# Patient Record
Sex: Female | Born: 1985 | Race: Black or African American | Hispanic: No | Marital: Single | Smoking: Never smoker
Health system: Southern US, Community
[De-identification: ages and names within clinical notes are randomized; demographics above are authoritative.]

## PROBLEM LIST (undated history)

## (undated) DIAGNOSIS — N200 Calculus of kidney: Secondary | ICD-10-CM

## (undated) DIAGNOSIS — A749 Chlamydial infection, unspecified: Secondary | ICD-10-CM

## (undated) DIAGNOSIS — L309 Dermatitis, unspecified: Secondary | ICD-10-CM

## (undated) DIAGNOSIS — K219 Gastro-esophageal reflux disease without esophagitis: Secondary | ICD-10-CM

## (undated) DIAGNOSIS — J45909 Unspecified asthma, uncomplicated: Secondary | ICD-10-CM

## (undated) DIAGNOSIS — K819 Cholecystitis, unspecified: Secondary | ICD-10-CM

## (undated) DIAGNOSIS — I499 Cardiac arrhythmia, unspecified: Secondary | ICD-10-CM

## (undated) HISTORY — PX: CHOLECYSTECTOMY: SHX55

## (undated) HISTORY — PX: KNEE SURGERY: SHX244

---
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
## 2005-01-21 ENCOUNTER — Emergency Department (HOSPITAL_COMMUNITY): Admission: EM | Admit: 2005-01-21 | Discharge: 2005-01-21 | Payer: Self-pay | Admitting: Emergency Medicine

## 2005-01-31 ENCOUNTER — Emergency Department (HOSPITAL_COMMUNITY): Admission: EM | Admit: 2005-01-31 | Discharge: 2005-01-31 | Payer: Self-pay | Admitting: Emergency Medicine

## 2005-03-24 ENCOUNTER — Emergency Department (HOSPITAL_COMMUNITY): Admission: EM | Admit: 2005-03-24 | Discharge: 2005-03-24 | Payer: Self-pay | Admitting: Family Medicine

## 2005-05-03 ENCOUNTER — Emergency Department (HOSPITAL_COMMUNITY): Admission: EM | Admit: 2005-05-03 | Discharge: 2005-05-03 | Payer: Self-pay | Admitting: Family Medicine

## 2005-05-05 ENCOUNTER — Emergency Department (HOSPITAL_COMMUNITY): Admission: EM | Admit: 2005-05-05 | Discharge: 2005-05-05 | Payer: Self-pay | Admitting: Emergency Medicine

## 2005-05-14 ENCOUNTER — Emergency Department (HOSPITAL_COMMUNITY): Admission: EM | Admit: 2005-05-14 | Discharge: 2005-05-14 | Payer: Self-pay | Admitting: Family Medicine

## 2005-06-06 ENCOUNTER — Emergency Department (HOSPITAL_COMMUNITY): Admission: EM | Admit: 2005-06-06 | Discharge: 2005-06-06 | Payer: Self-pay | Admitting: Family Medicine

## 2005-12-23 ENCOUNTER — Emergency Department (HOSPITAL_COMMUNITY): Admission: EM | Admit: 2005-12-23 | Discharge: 2005-12-23 | Payer: Self-pay | Admitting: Family Medicine

## 2006-04-01 ENCOUNTER — Emergency Department (HOSPITAL_COMMUNITY): Admission: EM | Admit: 2006-04-01 | Discharge: 2006-04-01 | Payer: Self-pay | Admitting: Emergency Medicine

## 2006-06-02 ENCOUNTER — Inpatient Hospital Stay (HOSPITAL_COMMUNITY): Admission: AD | Admit: 2006-06-02 | Discharge: 2006-06-02 | Payer: Self-pay | Admitting: Family Medicine

## 2006-06-08 ENCOUNTER — Inpatient Hospital Stay (HOSPITAL_COMMUNITY): Admission: AD | Admit: 2006-06-08 | Discharge: 2006-06-08 | Payer: Self-pay | Admitting: Gynecology

## 2006-06-16 ENCOUNTER — Ambulatory Visit (HOSPITAL_COMMUNITY): Admission: RE | Admit: 2006-06-16 | Discharge: 2006-06-16 | Payer: Self-pay | Admitting: Gynecology

## 2006-07-05 ENCOUNTER — Inpatient Hospital Stay (HOSPITAL_COMMUNITY): Admission: AD | Admit: 2006-07-05 | Discharge: 2006-07-05 | Payer: Self-pay | Admitting: Obstetrics & Gynecology

## 2006-08-15 ENCOUNTER — Ambulatory Visit (HOSPITAL_COMMUNITY): Admission: RE | Admit: 2006-08-15 | Discharge: 2006-08-15 | Payer: Self-pay | Admitting: Gynecology

## 2006-12-09 ENCOUNTER — Ambulatory Visit: Payer: Self-pay | Admitting: *Deleted

## 2006-12-09 ENCOUNTER — Inpatient Hospital Stay (HOSPITAL_COMMUNITY): Admission: AD | Admit: 2006-12-09 | Discharge: 2006-12-09 | Payer: Self-pay | Admitting: Obstetrics and Gynecology

## 2009-03-14 ENCOUNTER — Emergency Department (HOSPITAL_COMMUNITY): Admission: EM | Admit: 2009-03-14 | Discharge: 2009-03-14 | Payer: Self-pay | Admitting: Family Medicine

## 2009-10-26 ENCOUNTER — Inpatient Hospital Stay (HOSPITAL_COMMUNITY): Admission: AD | Admit: 2009-10-26 | Discharge: 2009-10-26 | Payer: Self-pay | Admitting: Obstetrics & Gynecology

## 2009-11-21 ENCOUNTER — Emergency Department (HOSPITAL_COMMUNITY): Admission: EM | Admit: 2009-11-21 | Discharge: 2009-11-21 | Payer: Self-pay | Admitting: Emergency Medicine

## 2010-01-12 ENCOUNTER — Inpatient Hospital Stay (HOSPITAL_COMMUNITY): Admission: AD | Admit: 2010-01-12 | Discharge: 2010-01-13 | Payer: Self-pay | Admitting: Obstetrics & Gynecology

## 2010-01-29 ENCOUNTER — Inpatient Hospital Stay (HOSPITAL_COMMUNITY): Admission: AD | Admit: 2010-01-29 | Discharge: 2010-01-29 | Payer: Self-pay | Admitting: Obstetrics & Gynecology

## 2010-02-22 ENCOUNTER — Inpatient Hospital Stay (HOSPITAL_COMMUNITY): Admission: AD | Admit: 2010-02-22 | Discharge: 2010-02-22 | Payer: Self-pay | Admitting: Obstetrics and Gynecology

## 2010-03-06 ENCOUNTER — Inpatient Hospital Stay (HOSPITAL_COMMUNITY): Admission: AD | Admit: 2010-03-06 | Discharge: 2010-03-07 | Payer: Self-pay | Admitting: Obstetrics & Gynecology

## 2010-04-02 ENCOUNTER — Ambulatory Visit (HOSPITAL_COMMUNITY): Admission: RE | Admit: 2010-04-02 | Discharge: 2010-04-02 | Payer: Self-pay | Admitting: Obstetrics

## 2010-06-02 ENCOUNTER — Inpatient Hospital Stay (HOSPITAL_COMMUNITY): Admission: AD | Admit: 2010-06-02 | Discharge: 2010-06-02 | Payer: Self-pay | Admitting: Obstetrics

## 2010-06-15 ENCOUNTER — Emergency Department (HOSPITAL_COMMUNITY): Admission: EM | Admit: 2010-06-15 | Discharge: 2010-06-15 | Payer: Self-pay | Admitting: Emergency Medicine

## 2010-06-18 ENCOUNTER — Ambulatory Visit (HOSPITAL_COMMUNITY): Admission: RE | Admit: 2010-06-18 | Discharge: 2010-06-18 | Payer: Self-pay | Admitting: Obstetrics

## 2010-06-23 ENCOUNTER — Ambulatory Visit (HOSPITAL_COMMUNITY): Admission: RE | Admit: 2010-06-23 | Discharge: 2010-06-23 | Payer: Self-pay | Admitting: Obstetrics

## 2010-07-29 ENCOUNTER — Inpatient Hospital Stay (HOSPITAL_COMMUNITY)
Admission: AD | Admit: 2010-07-29 | Discharge: 2010-07-29 | Payer: Self-pay | Source: Home / Self Care | Admitting: Obstetrics & Gynecology

## 2010-08-12 ENCOUNTER — Emergency Department (HOSPITAL_COMMUNITY)
Admission: EM | Admit: 2010-08-12 | Discharge: 2010-08-12 | Payer: Self-pay | Source: Home / Self Care | Admitting: Family Medicine

## 2010-08-18 ENCOUNTER — Inpatient Hospital Stay (HOSPITAL_COMMUNITY)
Admission: RE | Admit: 2010-08-18 | Discharge: 2010-08-20 | Payer: Self-pay | Source: Home / Self Care | Admitting: Obstetrics

## 2010-11-01 ENCOUNTER — Encounter: Payer: Self-pay | Admitting: Obstetrics

## 2010-12-22 ENCOUNTER — Emergency Department (HOSPITAL_COMMUNITY)
Admission: EM | Admit: 2010-12-22 | Discharge: 2010-12-22 | Disposition: A | Discharge status: Home / Self Care | Payer: Medicaid Other | Attending: Emergency Medicine | Admitting: Emergency Medicine

## 2010-12-22 DIAGNOSIS — R42 Dizziness and giddiness: Secondary | ICD-10-CM | POA: Insufficient documentation

## 2010-12-22 DIAGNOSIS — R51 Headache: Secondary | ICD-10-CM | POA: Insufficient documentation

## 2010-12-22 DIAGNOSIS — J069 Acute upper respiratory infection, unspecified: Secondary | ICD-10-CM | POA: Insufficient documentation

## 2010-12-22 LAB — CBC
HCT: 30.8 % — ABNORMAL LOW (ref 36.0–46.0)
HCT: 33.8 % — ABNORMAL LOW (ref 36.0–46.0)
Hemoglobin: 10.4 g/dL — ABNORMAL LOW (ref 12.0–15.0)
Hemoglobin: 11.4 g/dL — ABNORMAL LOW (ref 12.0–15.0)
MCH: 29.6 pg (ref 26.0–34.0)
MCH: 29.8 pg (ref 26.0–34.0)
MCHC: 33.7 g/dL (ref 30.0–36.0)
MCHC: 33.8 g/dL (ref 30.0–36.0)
MCV: 87.8 fL (ref 78.0–100.0)
MCV: 88.5 fL (ref 78.0–100.0)
Platelets: 182 10*3/uL (ref 150–400)
Platelets: 201 10*3/uL (ref 150–400)
RBC: 3.48 MIL/uL — ABNORMAL LOW (ref 3.87–5.11)
RBC: 3.85 MIL/uL — ABNORMAL LOW (ref 3.87–5.11)
RDW: 14 % (ref 11.5–15.5)
RDW: 14.3 % (ref 11.5–15.5)
WBC: 6.1 10*3/uL (ref 4.0–10.5)
WBC: 9.4 10*3/uL (ref 4.0–10.5)

## 2010-12-22 LAB — RPR: RPR Ser Ql: NONREACTIVE

## 2010-12-23 LAB — URINALYSIS, ROUTINE W REFLEX MICROSCOPIC
Glucose, UA: NEGATIVE mg/dL
Ketones, ur: 15 mg/dL — AB
Nitrite: NEGATIVE
Protein, ur: 30 mg/dL — AB
Specific Gravity, Urine: 1.025 (ref 1.005–1.030)
Urobilinogen, UA: 4 mg/dL — ABNORMAL HIGH (ref 0.0–1.0)
pH: 6 (ref 5.0–8.0)

## 2010-12-23 LAB — URINE CULTURE
Colony Count: 30000
Culture  Setup Time: 201110200106

## 2010-12-23 LAB — URINE MICROSCOPIC-ADD ON

## 2010-12-25 LAB — URINE CULTURE
Colony Count: 30000
Culture  Setup Time: 201108240037

## 2010-12-25 LAB — URINALYSIS, ROUTINE W REFLEX MICROSCOPIC
Bilirubin Urine: NEGATIVE
Glucose, UA: NEGATIVE mg/dL
Hgb urine dipstick: NEGATIVE
Ketones, ur: NEGATIVE mg/dL
Nitrite: NEGATIVE
Protein, ur: NEGATIVE mg/dL
Specific Gravity, Urine: 1.025 (ref 1.005–1.030)
Urobilinogen, UA: 0.2 mg/dL (ref 0.0–1.0)
pH: 6.5 (ref 5.0–8.0)

## 2010-12-25 LAB — URINE MICROSCOPIC-ADD ON

## 2010-12-25 LAB — WET PREP, GENITAL
Clue Cells Wet Prep HPF POC: NONE SEEN
Trich, Wet Prep: NONE SEEN
Yeast Wet Prep HPF POC: NONE SEEN

## 2010-12-27 LAB — URINE MICROSCOPIC-ADD ON

## 2010-12-27 LAB — POCT PREGNANCY, URINE: Preg Test, Ur: NEGATIVE

## 2010-12-27 LAB — URINALYSIS, ROUTINE W REFLEX MICROSCOPIC
Bilirubin Urine: NEGATIVE
Glucose, UA: NEGATIVE mg/dL
Ketones, ur: NEGATIVE mg/dL
Leukocytes, UA: NEGATIVE
Nitrite: NEGATIVE
Protein, ur: NEGATIVE mg/dL
Specific Gravity, Urine: >1.03 — ABNORMAL HIGH (ref 1.005–1.030)
Urobilinogen, UA: 0.2 mg/dL (ref 0.0–1.0)
pH: 5.5 (ref 5.0–8.0)

## 2010-12-28 LAB — URINE CULTURE: Colony Count: >=100000

## 2010-12-28 LAB — URINALYSIS, ROUTINE W REFLEX MICROSCOPIC
Bilirubin Urine: NEGATIVE
Bilirubin Urine: NEGATIVE
Glucose, UA: NEGATIVE mg/dL
Glucose, UA: NEGATIVE mg/dL
Ketones, ur: 15 mg/dL — AB
Ketones, ur: NEGATIVE mg/dL
Leukocytes, UA: NEGATIVE
Nitrite: NEGATIVE
Nitrite: NEGATIVE
Protein, ur: NEGATIVE mg/dL
Protein, ur: NEGATIVE mg/dL
Specific Gravity, Urine: >1.03 — ABNORMAL HIGH (ref 1.005–1.030)
Specific Gravity, Urine: >1.03 — ABNORMAL HIGH (ref 1.005–1.030)
Urobilinogen, UA: 0.2 mg/dL (ref 0.0–1.0)
Urobilinogen, UA: 0.2 mg/dL (ref 0.0–1.0)
pH: 6 (ref 5.0–8.0)
pH: 6 (ref 5.0–8.0)

## 2010-12-28 LAB — URINE MICROSCOPIC-ADD ON

## 2010-12-29 LAB — URINALYSIS, ROUTINE W REFLEX MICROSCOPIC
Bilirubin Urine: NEGATIVE
Glucose, UA: NEGATIVE mg/dL
Hgb urine dipstick: NEGATIVE
Ketones, ur: NEGATIVE mg/dL
Nitrite: NEGATIVE
Protein, ur: NEGATIVE mg/dL
Specific Gravity, Urine: 1.025 (ref 1.005–1.030)
Urobilinogen, UA: 1 mg/dL (ref 0.0–1.0)
pH: 6 (ref 5.0–8.0)

## 2010-12-29 LAB — COMPREHENSIVE METABOLIC PANEL
ALT: 21 U/L (ref 0–35)
AST: 21 U/L (ref 0–37)
Albumin: 3.6 g/dL (ref 3.5–5.2)
Alkaline Phosphatase: 45 U/L (ref 39–117)
BUN: 12 mg/dL (ref 6–23)
CO2: 25 mEq/L (ref 19–32)
Calcium: 9.4 mg/dL (ref 8.4–10.5)
Chloride: 105 mEq/L (ref 96–112)
Creatinine, Ser: 0.79 mg/dL (ref 0.4–1.2)
GFR calc Af Amer: >60 mL/min (ref >60)
GFR calc non Af Amer: >60 mL/min (ref >60)
Glucose, Bld: 91 mg/dL (ref 70–99)
Potassium: 4.1 mEq/L (ref 3.5–5.1)
Sodium: 136 mEq/L (ref 135–145)
Total Bilirubin: 0.3 mg/dL (ref 0.3–1.2)
Total Protein: 7.2 g/dL (ref 6.0–8.3)

## 2010-12-29 LAB — CBC
HCT: 33.8 % — ABNORMAL LOW (ref 36.0–46.0)
Hemoglobin: 11.6 g/dL — ABNORMAL LOW (ref 12.0–15.0)
MCHC: 34.4 g/dL (ref 30.0–36.0)
MCV: 85.9 fL (ref 78.0–100.0)
Platelets: 259 10*3/uL (ref 150–400)
RBC: 3.94 MIL/uL (ref 3.87–5.11)
RDW: 14 % (ref 11.5–15.5)
WBC: 5.3 10*3/uL (ref 4.0–10.5)

## 2010-12-29 LAB — URINE MICROSCOPIC-ADD ON

## 2010-12-29 LAB — URINE CULTURE
Colony Count: NO GROWTH
Culture: NO GROWTH

## 2010-12-30 LAB — ABO/RH: ABO/RH(D): A POS

## 2010-12-30 LAB — WET PREP, GENITAL
Clue Cells Wet Prep HPF POC: NONE SEEN
Trich, Wet Prep: NONE SEEN
Yeast Wet Prep HPF POC: NONE SEEN

## 2010-12-30 LAB — URINALYSIS, ROUTINE W REFLEX MICROSCOPIC
Bilirubin Urine: NEGATIVE
Glucose, UA: NEGATIVE mg/dL
Hgb urine dipstick: NEGATIVE
Ketones, ur: NEGATIVE mg/dL
Nitrite: NEGATIVE
Protein, ur: NEGATIVE mg/dL
Specific Gravity, Urine: >1.03 — ABNORMAL HIGH (ref 1.005–1.030)
Urobilinogen, UA: 0.2 mg/dL (ref 0.0–1.0)
pH: 6 (ref 5.0–8.0)

## 2010-12-30 LAB — GC/CHLAMYDIA PROBE AMP, GENITAL
Chlamydia, DNA Probe: NEGATIVE
GC Probe Amp, Genital: NEGATIVE

## 2010-12-30 LAB — CBC
HCT: 34.3 % — ABNORMAL LOW (ref 36.0–46.0)
Hemoglobin: 11.4 g/dL — ABNORMAL LOW (ref 12.0–15.0)
MCHC: 33.1 g/dL (ref 30.0–36.0)
MCV: 87.6 fL (ref 78.0–100.0)
Platelets: 268 10*3/uL (ref 150–400)
RBC: 3.91 MIL/uL (ref 3.87–5.11)
RDW: 14.1 % (ref 11.5–15.5)
WBC: 5.9 10*3/uL (ref 4.0–10.5)

## 2010-12-30 LAB — HCG, QUANTITATIVE, PREGNANCY: hCG, Beta Chain, Quant, S: 66287 m[IU]/mL — ABNORMAL HIGH (ref <5)

## 2010-12-30 LAB — POCT PREGNANCY, URINE: Preg Test, Ur: POSITIVE

## 2010-12-31 LAB — RAPID STREP SCREEN (MED CTR MEBANE ONLY): Streptococcus, Group A Screen (Direct): NEGATIVE

## 2011-01-27 ENCOUNTER — Encounter: Payer: Medicaid Other | Attending: Internal Medicine | Admitting: *Deleted

## 2011-01-27 DIAGNOSIS — Z713 Dietary counseling and surveillance: Secondary | ICD-10-CM | POA: Insufficient documentation

## 2011-02-24 ENCOUNTER — Ambulatory Visit: Payer: Medicaid Other | Admitting: *Deleted

## 2011-05-13 ENCOUNTER — Inpatient Hospital Stay (INDEPENDENT_AMBULATORY_CARE_PROVIDER_SITE_OTHER)
Admission: RE | Admit: 2011-05-13 | Discharge: 2011-05-13 | Disposition: A | Discharge status: Home / Self Care | Payer: Medicaid Other | Source: Ambulatory Visit | Attending: Family Medicine | Admitting: Family Medicine

## 2011-05-13 DIAGNOSIS — R51 Headache: Secondary | ICD-10-CM

## 2011-07-16 ENCOUNTER — Inpatient Hospital Stay (INDEPENDENT_AMBULATORY_CARE_PROVIDER_SITE_OTHER)
Admission: RE | Admit: 2011-07-16 | Discharge: 2011-07-16 | Disposition: A | Discharge status: Home / Self Care | Payer: Medicaid Other | Source: Ambulatory Visit | Attending: Family Medicine | Admitting: Family Medicine

## 2011-07-16 ENCOUNTER — Ambulatory Visit (INDEPENDENT_AMBULATORY_CARE_PROVIDER_SITE_OTHER): Payer: Medicaid Other

## 2011-07-16 DIAGNOSIS — S5010XA Contusion of unspecified forearm, initial encounter: Secondary | ICD-10-CM

## 2011-07-16 DIAGNOSIS — S5000XA Contusion of unspecified elbow, initial encounter: Secondary | ICD-10-CM

## 2011-07-16 DIAGNOSIS — S63509A Unspecified sprain of unspecified wrist, initial encounter: Secondary | ICD-10-CM

## 2012-08-26 ENCOUNTER — Emergency Department (INDEPENDENT_AMBULATORY_CARE_PROVIDER_SITE_OTHER)
Admission: EM | Admit: 2012-08-26 | Discharge: 2012-08-26 | Disposition: A | Discharge status: Home / Self Care | Payer: Self-pay | Source: Home / Self Care

## 2012-08-26 ENCOUNTER — Encounter (HOSPITAL_COMMUNITY): Payer: Self-pay | Admitting: Emergency Medicine

## 2012-08-26 DIAGNOSIS — L218 Other seborrheic dermatitis: Secondary | ICD-10-CM

## 2012-08-26 DIAGNOSIS — L219 Seborrheic dermatitis, unspecified: Secondary | ICD-10-CM

## 2012-08-26 DIAGNOSIS — M25539 Pain in unspecified wrist: Secondary | ICD-10-CM

## 2012-08-26 HISTORY — DX: Unspecified asthma, uncomplicated: J45.909

## 2012-08-26 MED ORDER — HYDROCODONE-ACETAMINOPHEN 5-325 MG PO TABS
ORAL_TABLET | ORAL | Status: AC
  Filled 2012-08-26: qty 1

## 2012-08-26 MED ORDER — HYDROCORTISONE 1 % EX CREA: TOPICAL_CREAM | CUTANEOUS | Status: DC

## 2012-08-26 MED ORDER — HYDROCODONE-ACETAMINOPHEN 5-325 MG PO TABS
1.0000 | ORAL_TABLET | Freq: Once | ORAL | Status: AC
  Administered 2012-08-26: 1 via ORAL

## 2012-08-26 MED ORDER — KETOCONAZOLE 2 % EX SHAM: MEDICATED_SHAMPOO | CUTANEOUS | Status: DC

## 2012-08-26 MED ORDER — MELOXICAM 7.5 MG PO TABS: 7.5000 mg | ORAL_TABLET | Freq: Every day | ORAL | Status: DC

## 2012-08-26 MED ORDER — METHYLPREDNISOLONE 4 MG PO KIT: PACK | ORAL | Status: DC

## 2012-08-26 NOTE — ED Provider Notes (Signed)
Medical screening examination/treatment/procedure(s) were performed by non-physician practitioner and as supervising physician I was immediately available for consultation/collaboration.  Leslee Home, M.D.   Reuben Likes, MD 08/26/12 [phone]

## 2012-08-26 NOTE — ED Notes (Signed)
Pt c/o right hand pain... States that she heard her wrist "pop" as she was reaching for her cell phone this am... Also states that on 08/03/12, she was electrocuted as she was plugging in a vacuum at work and since then, her right hand has been feeling numb and painful... unable to rotate hand... Sx today include: nausea and diarrhea... Denies: fevers, vomiting... Saw a Doctor that her employer referred her to but did not find anything??

## 2012-08-26 NOTE — ED Provider Notes (Signed)
History     CSN: [id number]  Arrival date & time 08/26/12  0915   None     Chief Complaint  Patient presents with  . Hand Pain    (Consider location/radiation/quality/duration/timing/severity/associated sxs/prior treatment) Patient is a 26 y.o. female presenting with hand pain and rash. The history is provided by the patient.  Hand Pain This is a new problem. The current episode started more than 1 week ago. The problem occurs daily. The problem has not changed since onset.The symptoms are aggravated by bending and twisting. The symptoms are relieved by rest. She has tried nothing for the symptoms.  Rash  This is a chronic problem. The current episode started more than 1 week ago. The problem has been gradually worsening. The problem is associated with nothing. There has been no fever. The rash is present on the scalp and face. The patient is experiencing no pain. Associated symptoms include itching. She has tried nothing for the symptoms.  Patient reports she was reaching for her phone this morning and felt a "pop" in her right wrist.  States she has had pain in this wrist for weeks as result of work injury that was evaluated by work Development worker, community. Feels like pain is increasingly worse after this morning. Past Medical History  Diagnosis Date  . Asthma     Past Surgical History  Procedure Date  . Knee surgery     No family history on file.  History  Substance Use Topics  . Smoking status: Never Smoker   . Smokeless tobacco: Not on file  . Alcohol Use: Yes    OB History    Grav Para Term Preterm Abortions TAB SAB Ect Mult Living                  Review of Systems  Musculoskeletal: Positive for joint swelling and arthralgias.  Skin: Positive for itching and rash.  All other systems reviewed and are negative.    Allergies  Review of patient's allergies indicates no known allergies.  Home Medications   Current Outpatient Rx  Name  Route  Sig  Dispense  Refill  .  HYDROCORTISONE 1 % EX CREA      Apply to affected area 2 times daily   30 g   3   . KETOCONAZOLE 2 % EX SHAM   Topical   Apply topically 2 (two) times a week.   120 mL   11   . MELOXICAM 7.5 MG PO TABS   Oral   Take 1 tablet (7.5 mg total) by mouth daily.   30 tablet   1   . METHYLPREDNISOLONE 4 MG PO KIT      follow package directions   21 tablet   0     BP 103/56  Pulse 76  Temp 98.7 F (37.1 C) (Oral)  Resp 16  SpO2 100%  Physical Exam  Nursing note and vitals reviewed. Constitutional: She is oriented to person, place, and time. Vital signs are normal. She appears well-developed and well-nourished. She is active and cooperative.  HENT:  Head: Normocephalic.       Scalp seborrhea, facial dryness and scaly patches  Eyes: Conjunctivae normal are normal. Pupils are equal, round, and reactive to light. No scleral icterus.  Neck: Trachea normal. Neck supple.  Cardiovascular: Normal rate and regular rhythm.   Pulmonary/Chest: Effort normal and breath sounds normal.  Musculoskeletal: Normal range of motion.       Right elbow: Normal.  Right wrist: Normal.       Left wrist: Normal.       Right hand: Normal.       Painful ROM of right wrist  Neurological: She is alert and oriented to person, place, and time. No cranial nerve deficit or sensory deficit. GCS eye subscore is 4. GCS verbal subscore is 5. GCS motor subscore is 6.  Skin: Skin is warm and dry.  Psychiatric: She has a normal mood and affect. Her speech is normal and behavior is normal. Judgment and thought content normal. Cognition and memory are normal.    ED Course  Procedures (including critical care time)  Labs Reviewed - No data to display No results found.   1. Wrist pain   2. Seborrheic dermatitis of scalp       MDM  Wrist brace, follow up with orthopedist if symptoms do not improve.  Medications as prescribed.        Johnsie Kindred, NP 08/26/12 1010

## 2012-12-11 ENCOUNTER — Emergency Department (INDEPENDENT_AMBULATORY_CARE_PROVIDER_SITE_OTHER)
Admission: EM | Admit: 2012-12-11 | Discharge: 2012-12-11 | Disposition: A | Discharge status: Home / Self Care | Payer: Self-pay | Source: Home / Self Care

## 2012-12-11 ENCOUNTER — Encounter (HOSPITAL_COMMUNITY): Payer: Self-pay | Admitting: *Deleted

## 2012-12-11 DIAGNOSIS — B9789 Other viral agents as the cause of diseases classified elsewhere: Secondary | ICD-10-CM

## 2012-12-11 DIAGNOSIS — H8309 Labyrinthitis, unspecified ear: Secondary | ICD-10-CM

## 2012-12-11 DIAGNOSIS — B349 Viral infection, unspecified: Secondary | ICD-10-CM

## 2012-12-11 DIAGNOSIS — K5289 Other specified noninfective gastroenteritis and colitis: Secondary | ICD-10-CM

## 2012-12-11 DIAGNOSIS — K529 Noninfective gastroenteritis and colitis, unspecified: Secondary | ICD-10-CM

## 2012-12-11 MED ORDER — MECLIZINE HCL 25 MG PO TABS: 25.0000 mg | ORAL_TABLET | Freq: Three times a day (TID) | ORAL | Status: DC | PRN

## 2012-12-11 MED ORDER — ONDANSETRON HCL 4 MG PO TABS: 4.0000 mg | ORAL_TABLET | Freq: Four times a day (QID) | ORAL | Status: DC

## 2012-12-11 MED ORDER — ONDANSETRON HCL 4 MG/2ML IJ SOLN
INTRAMUSCULAR | Status: AC
  Filled 2012-12-11: qty 2

## 2012-12-11 MED ORDER — ONDANSETRON HCL 4 MG/2ML IJ SOLN
4.0000 mg | Freq: Once | INTRAMUSCULAR | Status: AC
  Administered 2012-12-11: 4 mg via INTRAMUSCULAR

## 2012-12-11 MED ORDER — HYOSCYAMINE SULFATE 0.125 MG SL SUBL: 0.1250 mg | SUBLINGUAL_TABLET | SUBLINGUAL | Status: DC | PRN

## 2012-12-11 NOTE — ED Provider Notes (Signed)
History     CSN: [id number]  Arrival date & time 12/11/12  1006   First MD Initiated Contact with Patient 12/11/12 1013      Chief Complaint  Patient presents with  . Generalized Body Aches    (Consider location/radiation/quality/duration/timing/severity/associated sxs/prior treatment) HPI Comments: 27 year old obese female in having dizziness and abdominal cramping for 3 days. Sometimes she will have vertigo particularly when arising from a supine position. It is short-lived it occurs frequently. Worse with other movements turning her head and sometimes with ambulation. Her abdominal cramps are in the epigastrium. She has diarrhea approximately 2-3 stools a day. No vomiting. She does have nausea. She feels tired and weak. She also has headaches in the biparietal and occipital areas. Denies problems with vision speech hearing or swallowing. Denies focal paresthesias or weakness. No syncope. Denies upper respiratory congestion symptoms.   Past Medical History  Diagnosis Date  . Asthma     Past Surgical History  Procedure Laterality Date  . Knee surgery      History reviewed. No pertinent family history.  History  Substance Use Topics  . Smoking status: Never Smoker   . Smokeless tobacco: Not on file  . Alcohol Use: Yes    OB History   Grav Para Term Preterm Abortions TAB SAB Ect Mult Living                  Review of Systems  Constitutional: Positive for activity change and fatigue. Negative for fever, chills and appetite change.  HENT: Negative for ear pain, congestion, facial swelling, rhinorrhea, mouth sores, neck pain, neck stiffness and postnasal drip.   Eyes: Negative.   Respiratory: Negative.   Cardiovascular: Negative.   Gastrointestinal: Positive for nausea, vomiting, abdominal pain and diarrhea. Negative for constipation, blood in stool and abdominal distention.  Genitourinary: Negative.   Musculoskeletal: Positive for myalgias.  Skin: Negative for pallor  and rash.  Neurological: Negative.   Psychiatric/Behavioral: Negative.     Allergies  Review of patient's allergies indicates no known allergies.  Home Medications   Current Outpatient Rx  Name  Route  Sig  Dispense  Refill  . hydrocortisone cream 1 %      Apply to affected area 2 times daily   30 g   3   . ketoconazole (NIZORAL) 2 % shampoo   Topical   Apply topically 2 (two) times a week.   120 mL   11   . meclizine (ANTIVERT) 25 MG tablet   Oral   Take 1 tablet (25 mg total) by mouth 3 (three) times daily as needed for dizziness.   28 tablet   0   . meloxicam (MOBIC) 7.5 MG tablet   Oral   Take 1 tablet (7.5 mg total) by mouth daily.   30 tablet   1   . methylPREDNISolone (MEDROL DOSEPAK) 4 MG tablet      follow package directions   21 tablet   0   . ondansetron (ZOFRAN) 4 MG tablet   Oral   Take 1 tablet (4 mg total) by mouth every 6 (six) hours. Prn nausea or vomiting   12 tablet   0     BP 113/82  Pulse 80  Temp(Src) 98.4 F (36.9 C) (Oral)  Resp 20  SpO2 98%  LMP 11/24/2012  Physical Exam  Nursing note and vitals reviewed. Constitutional: She is oriented to person, place, and time. She appears well-developed and well-nourished. No distress.  HENT:  Right Ear:  External ear normal.  Left Ear: External ear normal.  Nose: Nose normal.  Mouth/Throat: Oropharynx is clear and moist. No oropharyngeal exudate.  Eyes: Conjunctivae and EOM are normal.  Neck: Normal range of motion. Neck supple.  Cardiovascular: Normal rate, regular rhythm and normal heart sounds.   Pulmonary/Chest: Effort normal and breath sounds normal. No respiratory distress. She has no wheezes. She has no rales.  Abdominal: Soft. Bowel sounds are normal. She exhibits no distension and no mass. There is no rebound and no guarding.  Minor epigastric tenderness.  Musculoskeletal: Normal range of motion. She exhibits no edema.  Lymphadenopathy:    She has no cervical adenopathy.   Neurological: She is alert and oriented to person, place, and time.  Skin: Skin is warm and dry. No rash noted.  Psychiatric: She has a normal mood and affect.    ED Course  Procedures (including critical care time)  Labs Reviewed - No data to display No results found.   1. Gastroenteritis, acute   2. Viral labyrinthitis syndrome, unspecified laterality   3. Viral syndrome       MDM  Patient is discharged in stable condition. She is having dizziness but she is able to ambulate in a safe manner. She does not have an acute abdomen nor does she have neurologic deficits. Zofran 4 mg IM Zofran 4 mg by mouth every 6 hours when necessary nausea or vomiting Antivert 25 mg 3 times a day when necessary dizziness Rest at home and drink plenty of fluids. Best fluids to drink are Pedialyte and Gatorade. Out of work for 3 days. Wash her hands frequently If worsening symptoms or problems, unable to hold down medicines increased vomiting or diarrhea, increased fever or other problems may need to go to emergency department.        Hayden Rasmussen, NP 12/11/12 [phone]

## 2012-12-11 NOTE — ED Notes (Signed)
Pt  Has  Multiple   Symptoms  To   Include    dizzyness  boday  Aches  abd   Cramps  Slight  Diarrhea           Fatigue         Symptoms  X  3  Days  -  At this  Time  She is  Sitting  Upright on  Exam table  Speaking in  Complete  sentances  And  Is  In no  Distress

## 2012-12-11 NOTE — ED Provider Notes (Signed)
Medical screening examination/treatment/procedure(s) were performed by non-physician practitioner and as supervising physician I was immediately available for consultation/collaboration.  Leslee Home, M.D.   Reuben Likes, MD 12/11/12 [phone]

## 2013-03-21 ENCOUNTER — Emergency Department (HOSPITAL_COMMUNITY)
Admission: EM | Admit: 2013-03-21 | Discharge: 2013-03-21 | Disposition: A | Discharge status: Home / Self Care | Payer: Self-pay | Attending: Emergency Medicine | Admitting: Emergency Medicine

## 2013-03-21 ENCOUNTER — Encounter (HOSPITAL_COMMUNITY): Payer: Self-pay | Admitting: Emergency Medicine

## 2013-03-21 DIAGNOSIS — J45909 Unspecified asthma, uncomplicated: Secondary | ICD-10-CM | POA: Insufficient documentation

## 2013-03-21 DIAGNOSIS — X500XXA Overexertion from strenuous movement or load, initial encounter: Secondary | ICD-10-CM | POA: Insufficient documentation

## 2013-03-21 DIAGNOSIS — S39012A Strain of muscle, fascia and tendon of lower back, initial encounter: Secondary | ICD-10-CM

## 2013-03-21 DIAGNOSIS — IMO0002 Reserved for concepts with insufficient information to code with codable children: Secondary | ICD-10-CM | POA: Insufficient documentation

## 2013-03-21 DIAGNOSIS — Y9389 Activity, other specified: Secondary | ICD-10-CM | POA: Insufficient documentation

## 2013-03-21 DIAGNOSIS — Y9289 Other specified places as the place of occurrence of the external cause: Secondary | ICD-10-CM | POA: Insufficient documentation

## 2013-03-21 MED ORDER — IBUPROFEN 800 MG PO TABS
800.0000 mg | ORAL_TABLET | Freq: Once | ORAL | Status: AC
  Administered 2013-03-21: 800 mg via ORAL
  Filled 2013-03-21: qty 1

## 2013-03-21 MED ORDER — HYDROCODONE-ACETAMINOPHEN 5-325 MG PO TABS: 1.0000 | ORAL_TABLET | Freq: Four times a day (QID) | ORAL | Status: DC | PRN

## 2013-03-21 MED ORDER — OXYCODONE-ACETAMINOPHEN 5-325 MG PO TABS
1.0000 | ORAL_TABLET | Freq: Once | ORAL | Status: AC
  Administered 2013-03-21: 1 via ORAL
  Filled 2013-03-21: qty 1

## 2013-03-21 MED ORDER — DIAZEPAM 5 MG PO TABS
5.0000 mg | ORAL_TABLET | Freq: Once | ORAL | Status: AC
  Administered 2013-03-21: 5 mg via ORAL
  Filled 2013-03-21: qty 1

## 2013-03-21 MED ORDER — HYDROMORPHONE HCL PF 2 MG/ML IJ SOLN
2.0000 mg | Freq: Once | INTRAMUSCULAR | Status: AC
  Administered 2013-03-21: 1 mg via INTRAMUSCULAR
  Filled 2013-03-21: qty 1

## 2013-03-21 MED ORDER — NAPROXEN 500 MG PO TABS: 500.0000 mg | ORAL_TABLET | Freq: Two times a day (BID) | ORAL | Status: DC

## 2013-03-21 MED ORDER — DIAZEPAM 5 MG PO TABS: 5.0000 mg | ORAL_TABLET | Freq: Four times a day (QID) | ORAL | Status: DC | PRN

## 2013-03-21 NOTE — ED Notes (Signed)
Pt states she has a ride home. 

## 2013-03-21 NOTE — ED Provider Notes (Signed)
History     CSN: [id number]  Arrival date & time 03/21/13  1342   First MD Initiated Contact with Patient 03/21/13 1426      Chief Complaint  Patient presents with  . Back Pain    (Consider location/radiation/quality/duration/timing/severity/associated sxs/prior treatment) HPI  ABAGAIL LIMB is a 27 y.o. female with a history of asthma presents to the ED c/o back pain. Symptoms started this afternoon while cleaning the floor, pt stood up quickly, felt a snap, and immediately had sharp pain radiating from her right hip to her toes. Pain is rated at a 8-9/10 and can be slightly alleviated by leaning on her left hip. Denies trauma, numbness, saddle paresthesias, loss control of bowel or bladder, tingling or fever. Pain is worsened by walking and pt reports subjective right leg weakness. Pt denies CA hx, weight loss, IV drug use or immunocompromised state.     Past Medical History  Diagnosis Date  . Asthma     Past Surgical History  Procedure Laterality Date  . Knee surgery      No family history on file.  History  Substance Use Topics  . Smoking status: Never Smoker   . Smokeless tobacco: Not on file  . Alcohol Use: Yes    OB History   Grav Para Term Preterm Abortions TAB SAB Ect Mult Living                  Review of Systems Ten systems reviewed and are negative for acute change, except as noted in the HPI.    Allergies  Review of patient's allergies indicates no known allergies.  Home Medications   Current Outpatient Rx  Name  Route  Sig  Dispense  Refill  . Doxylamine-DM (VICKS NYQUIL COUGH PO)   Oral   Take 5 mLs by mouth daily as needed.         . hydrocortisone cream 1 %      Apply to affected area 2 times daily   30 g   3   . ibuprofen (ADVIL,MOTRIN) 200 MG tablet   Oral   Take 400 mg by mouth every 6 (six) hours as needed for pain.         . Ibuprofen-Diphenhydramine Cit (MOTRIN PM PO)   Oral   Take 2 tablets by mouth.            BP 127/82  Pulse 66  Temp(Src) 98.3 F (36.8 C) (Oral)  Resp 18  SpO2 100%  Physical Exam  Nursing note and vitals reviewed. Constitutional: She is oriented to person, place, and time. She appears well-developed and well-nourished. No distress.  HENT:  Head: Normocephalic and atraumatic.  Eyes: Conjunctivae and EOM are normal. Pupils are equal, round, and reactive to light. No scleral icterus.  Neck: Normal range of motion and full passive range of motion without pain. Neck supple. No spinous process tenderness and no muscular tenderness present. No rigidity. Normal range of motion present. No Brudzinski's sign noted.  Cardiovascular: Normal rate, regular rhythm and intact distal pulses.  Exam reveals no gallop and no friction rub.   No murmur heard. Intact distal pulses, capillary refill less than 3 seconds bilaterally.   Pulmonary/Chest: Effort normal and breath sounds normal. No respiratory distress. She has no wheezes. She has no rales. She exhibits no tenderness.  Musculoskeletal:  Bilateral lower extremities nontender without color change. Right straight leg test positive. Decreased ROM d/t pain  Neurological: She is alert and oriented to  person, place, and time. She has normal strength and normal reflexes. No sensory deficit. Abnormal gait: no ataxia, slowed and hunched d/t pain   Sensation at baseline for light touch in all 4 distal extremities, right lower extremity weakness 4/5 in comparison to left. Inability to ambulate d/t pain  Skin: Skin is warm and dry. No rash noted. She is not diaphoretic. No erythema. No pallor.  Psychiatric: She has a normal mood and affect.    ED Course  Procedures (including critical care time)  Labs Reviewed - No data to display No results found.   No diagnosis found.    MDM  27 year old morbidly obese patient emergency department complaining of lower back pain status post bending down and quickly standing not.  Unable to ambulate in  emergency department due to pain.  Originally treated with 800 Motrin, Ergostat, and Valium with only minimal relief.  Repeat exam is still concerning for possible objective right leg due to weakness.  IM Dilaudid given.  Provider resuming care will reevaluate physical exam to better differentiate subjective versus objective weakness to see if imaging is needed.  Patient is without signs of cauda equina including loss control of bowel or bladder, saddle paresthesia.  No concern for infection or cancer at this time as patient is not an IV drug user, afebrile, cancer free and not immunocompromised.        Jaci Carrel, [place] 03/21/13 1549

## 2013-03-21 NOTE — ED Provider Notes (Signed)
Pt felt much better after receiving pain medication.  Able to ambulate and rate pain as a 3 when ambulating.  Stable for discharge.  Return precaution discussed.   Fayrene Helper, PA-C 03/21/13 1653

## 2013-03-21 NOTE — Progress Notes (Signed)
[facility] has seen patient and provided her with a oc application. °

## 2013-03-21 NOTE — ED Notes (Signed)
Per EMS: Pt c/o mid/low back pain x 1 hour.  States she was bending down, cleaning, stood up, and began having the pain.

## 2013-03-22 NOTE — ED Provider Notes (Signed)
Medical screening examination/treatment/procedure(s) were performed by non-physician practitioner and as supervising physician I was immediately available for consultation/collaboration.  Mikyla Schachter T Derrek Puff, MD 03/22/13 1313 

## 2013-03-22 NOTE — ED Provider Notes (Signed)
Medical screening examination/treatment/procedure(s) were performed by non-physician practitioner and as supervising physician I was immediately available for consultation/collaboration.   Araceli Arango, MD 03/22/13 1602 

## 2013-03-31 ENCOUNTER — Emergency Department (HOSPITAL_COMMUNITY): Payer: Self-pay

## 2013-03-31 ENCOUNTER — Encounter (HOSPITAL_COMMUNITY): Payer: Self-pay

## 2013-03-31 ENCOUNTER — Emergency Department (HOSPITAL_COMMUNITY)
Admission: EM | Admit: 2013-03-31 | Discharge: 2013-03-31 | Disposition: A | Discharge status: Home / Self Care | Payer: Self-pay | Attending: Emergency Medicine | Admitting: Emergency Medicine

## 2013-03-31 DIAGNOSIS — R350 Frequency of micturition: Secondary | ICD-10-CM | POA: Insufficient documentation

## 2013-03-31 DIAGNOSIS — R300 Dysuria: Secondary | ICD-10-CM | POA: Insufficient documentation

## 2013-03-31 DIAGNOSIS — N390 Urinary tract infection, site not specified: Secondary | ICD-10-CM | POA: Insufficient documentation

## 2013-03-31 DIAGNOSIS — R197 Diarrhea, unspecified: Secondary | ICD-10-CM | POA: Insufficient documentation

## 2013-03-31 DIAGNOSIS — Z3202 Encounter for pregnancy test, result negative: Secondary | ICD-10-CM | POA: Insufficient documentation

## 2013-03-31 DIAGNOSIS — Z79899 Other long term (current) drug therapy: Secondary | ICD-10-CM | POA: Insufficient documentation

## 2013-03-31 DIAGNOSIS — J45909 Unspecified asthma, uncomplicated: Secondary | ICD-10-CM | POA: Insufficient documentation

## 2013-03-31 DIAGNOSIS — N83209 Unspecified ovarian cyst, unspecified side: Secondary | ICD-10-CM | POA: Insufficient documentation

## 2013-03-31 DIAGNOSIS — N200 Calculus of kidney: Secondary | ICD-10-CM | POA: Insufficient documentation

## 2013-03-31 DIAGNOSIS — R112 Nausea with vomiting, unspecified: Secondary | ICD-10-CM | POA: Insufficient documentation

## 2013-03-31 LAB — COMPREHENSIVE METABOLIC PANEL
ALT: 26 U/L (ref 0–35)
AST: 22 U/L (ref 0–37)
Albumin: 3.8 g/dL (ref 3.5–5.2)
Alkaline Phosphatase: 59 U/L (ref 39–117)
BUN: 15 mg/dL (ref 6–23)
CO2: 26 mEq/L (ref 19–32)
Calcium: 9.4 mg/dL (ref 8.4–10.5)
Chloride: 103 mEq/L (ref 96–112)
Creatinine, Ser: 1.51 mg/dL — ABNORMAL HIGH (ref 0.50–1.10)
GFR calc Af Amer: 53 mL/min — ABNORMAL LOW (ref >90)
GFR calc non Af Amer: 46 mL/min — ABNORMAL LOW (ref >90)
Glucose, Bld: 121 mg/dL — ABNORMAL HIGH (ref 70–99)
Potassium: 3.8 mEq/L (ref 3.5–5.1)
Sodium: 137 mEq/L (ref 135–145)
Total Bilirubin: 0.2 mg/dL — ABNORMAL LOW (ref 0.3–1.2)
Total Protein: 7.8 g/dL (ref 6.0–8.3)

## 2013-03-31 LAB — URINALYSIS, ROUTINE W REFLEX MICROSCOPIC
Glucose, UA: NEGATIVE mg/dL
Ketones, ur: NEGATIVE mg/dL
Nitrite: NEGATIVE
Protein, ur: 30 mg/dL — AB
Specific Gravity, Urine: 1.034 — ABNORMAL HIGH (ref 1.005–1.030)
Urobilinogen, UA: 1 mg/dL (ref 0.0–1.0)
pH: 5.5 (ref 5.0–8.0)

## 2013-03-31 LAB — CBC WITH DIFFERENTIAL/PLATELET
Basophils Absolute: 0 10*3/uL (ref 0.0–0.1)
Basophils Relative: 0 % (ref 0–1)
Eosinophils Absolute: 0.1 10*3/uL (ref 0.0–0.7)
Eosinophils Relative: 1 % (ref 0–5)
HCT: 36.8 % (ref 36.0–46.0)
Hemoglobin: 12.2 g/dL (ref 12.0–15.0)
Lymphocytes Relative: 11 % — ABNORMAL LOW (ref 12–46)
Lymphs Abs: 1.1 10*3/uL (ref 0.7–4.0)
MCH: 28.6 pg (ref 26.0–34.0)
MCHC: 33.2 g/dL (ref 30.0–36.0)
MCV: 86.4 fL (ref 78.0–100.0)
Monocytes Absolute: 0.4 10*3/uL (ref 0.1–1.0)
Monocytes Relative: 4 % (ref 3–12)
Neutro Abs: 8.3 10*3/uL — ABNORMAL HIGH (ref 1.7–7.7)
Neutrophils Relative %: 84 % — ABNORMAL HIGH (ref 43–77)
Platelets: 240 10*3/uL (ref 150–400)
RBC: 4.26 MIL/uL (ref 3.87–5.11)
RDW: 13 % (ref 11.5–15.5)
WBC: 9.8 10*3/uL (ref 4.0–10.5)

## 2013-03-31 LAB — URINE MICROSCOPIC-ADD ON

## 2013-03-31 LAB — WET PREP, GENITAL: Yeast Wet Prep HPF POC: NONE SEEN

## 2013-03-31 LAB — POCT PREGNANCY, URINE: Preg Test, Ur: NEGATIVE

## 2013-03-31 MED ORDER — CEPHALEXIN 500 MG PO CAPS: 500.0000 mg | ORAL_CAPSULE | Freq: Four times a day (QID) | ORAL | Status: DC

## 2013-03-31 MED ORDER — HYDROMORPHONE HCL PF 1 MG/ML IJ SOLN
1.0000 mg | Freq: Once | INTRAMUSCULAR | Status: DC
  Filled 2013-03-31: qty 1

## 2013-03-31 MED ORDER — SODIUM CHLORIDE 0.9 % IV BOLUS (SEPSIS)
1000.0000 mL | Freq: Once | INTRAVENOUS | Status: AC
  Administered 2013-03-31: 1000 mL via INTRAVENOUS

## 2013-03-31 MED ORDER — ONDANSETRON HCL 4 MG/2ML IJ SOLN
4.0000 mg | Freq: Once | INTRAMUSCULAR | Status: AC
  Administered 2013-03-31: 4 mg via INTRAVENOUS
  Filled 2013-03-31: qty 2

## 2013-03-31 NOTE — ED Notes (Signed)
Pt stated she thinks she passed a stone several minutes ago while she was in the restroom.

## 2013-03-31 NOTE — ED Provider Notes (Signed)
History     CSN: [id number]  Arrival date & time 03/31/13  0703   First MD Initiated Contact with Patient 03/31/13 [phone]      Chief Complaint  Patient presents with  . Abdominal Pain    (Consider location/radiation/quality/duration/timing/severity/associated sxs/prior treatment) HPI Comments: Patient presents to the emergency department with chief complaint of left lower quadrant pain, which began around 2 or 3:00 this morning. She states the pain radiates to her left groin. She also endorses nausea, vomiting, and loose stools. She denies seeing any blood in her vomit or stool. She states that she has taken some Motrin for the pain, but this has not helped. She states that the pain is moderate in severity, and is a sharp aching in nature. She also endorses dysuria and frequency. She states the pain is better when she lays on her side.  The history is provided by the patient. No language interpreter was used.    Past Medical History  Diagnosis Date  . Asthma     Past Surgical History  Procedure Laterality Date  . Knee surgery      No family history on file.  History  Substance Use Topics  . Smoking status: Never Smoker   . Smokeless tobacco: Not on file  . Alcohol Use: Yes    OB History   Grav Para Term Preterm Abortions TAB SAB Ect Mult Living                  Review of Systems  All other systems reviewed and are negative.    Allergies  Review of patient's allergies indicates no known allergies.  Home Medications   Current Outpatient Rx  Name  Route  Sig  Dispense  Refill  . diazepam (VALIUM) 5 MG tablet   Oral   Take 1 tablet (5 mg total) by mouth every 6 (six) hours as needed for anxiety.   20 tablet   0   . Doxylamine-DM (VICKS NYQUIL COUGH PO)   Oral   Take 5 mLs by mouth daily as needed.         Marland Kitchen HYDROcodone-acetaminophen (NORCO/VICODIN) 5-325 MG per tablet   Oral   Take 1 tablet by mouth every 6 (six) hours as needed for pain.   15  tablet   0   . hydrocortisone cream 1 %      Apply to affected area 2 times daily   30 g   3   . ibuprofen (ADVIL,MOTRIN) 200 MG tablet   Oral   Take 400 mg by mouth every 6 (six) hours as needed for pain.         . Ibuprofen-Diphenhydramine Cit (MOTRIN PM PO)   Oral   Take 2 tablets by mouth.         . naproxen (NAPROSYN) 500 MG tablet   Oral   Take 1 tablet (500 mg total) by mouth 2 (two) times daily.   30 tablet   0     BP 147/98  Pulse 60  Temp(Src) 98 F (36.7 C) (Oral)  SpO2 100%  LMP 02/24/2013  Physical Exam  Nursing note and vitals reviewed. Constitutional: She is oriented to person, place, and time. She appears well-developed and well-nourished.  HENT:  Head: Normocephalic and atraumatic.  Eyes: Conjunctivae and EOM are normal. Pupils are equal, round, and reactive to light.  Neck: Normal range of motion. Neck supple.  Cardiovascular: Normal rate and regular rhythm.  Exam reveals no gallop and no friction  rub.   No murmur heard. Pulmonary/Chest: Effort normal and breath sounds normal. No respiratory distress. She has no wheezes. She has no rales. She exhibits no tenderness.  Abdominal: Soft. Bowel sounds are normal. She exhibits no distension and no mass. There is tenderness. There is no rebound and no guarding. Hernia confirmed negative in the right inguinal area and confirmed negative in the left inguinal area.  Left lower quadrant abdominal pain, no other focal abdominal tenderness, no signs of surgical acute abdomen  Genitourinary: No labial fusion. There is no rash, tenderness, lesion or injury on the right labia. There is no rash, tenderness, lesion or injury on the left labia. Uterus is not deviated, not enlarged, not fixed and not tender. Cervix exhibits no motion tenderness, no discharge and no friability. Right adnexum displays no mass, no tenderness and no fullness. Left adnexum displays no mass, no tenderness and no fullness. No erythema,  tenderness or bleeding around the vagina. No foreign body around the vagina. No signs of injury around the vagina. Vaginal discharge found.  Musculoskeletal: Normal range of motion. She exhibits no edema and no tenderness.  Lymphadenopathy:       Right: No inguinal adenopathy present.       Left: No inguinal adenopathy present.  Neurological: She is alert and oriented to person, place, and time.  Skin: Skin is warm and dry.  Psychiatric: She has a normal mood and affect. Her behavior is normal. Judgment and thought content normal.    ED Course  Procedures (including critical care time)  Results for orders placed during the hospital encounter of 03/31/13  WET PREP, GENITAL      Result Value Range   Yeast Wet Prep HPF POC NONE SEEN  NONE SEEN   Trich, Wet Prep FEW (*) NONE SEEN   Clue Cells Wet Prep HPF POC FEW (*) NONE SEEN   WBC, Wet Prep HPF POC FEW (*) NONE SEEN  CBC WITH DIFFERENTIAL      Result Value Range   WBC 9.8  4.0 - 10.5 K/uL   RBC 4.26  3.87 - 5.11 MIL/uL   Hemoglobin 12.2  12.0 - 15.0 g/dL   HCT 40.9  81.1 - 91.4 %   MCV 86.4  78.0 - 100.0 fL   MCH 28.6  26.0 - 34.0 pg   MCHC 33.2  30.0 - 36.0 g/dL   RDW 78.2  95.6 - 21.3 %   Platelets 240  150 - 400 K/uL   Neutrophils Relative % 84 (*) 43 - 77 %   Neutro Abs 8.3 (*) 1.7 - 7.7 K/uL   Lymphocytes Relative 11 (*) 12 - 46 %   Lymphs Abs 1.1  0.7 - 4.0 K/uL   Monocytes Relative 4  3 - 12 %   Monocytes Absolute 0.4  0.1 - 1.0 K/uL   Eosinophils Relative 1  0 - 5 %   Eosinophils Absolute 0.1  0.0 - 0.7 K/uL   Basophils Relative 0  0 - 1 %   Basophils Absolute 0.0  0.0 - 0.1 K/uL  COMPREHENSIVE METABOLIC PANEL      Result Value Range   Sodium 137  135 - 145 mEq/L   Potassium 3.8  3.5 - 5.1 mEq/L   Chloride 103  96 - 112 mEq/L   CO2 26  19 - 32 mEq/L   Glucose, Bld 121 (*) 70 - 99 mg/dL   BUN 15  6 - 23 mg/dL   Creatinine, Ser 0.86 (*) 0.50 -  1.10 mg/dL   Calcium 9.4  8.4 - 62.1 mg/dL   Total Protein 7.8  6.0 -  8.3 g/dL   Albumin 3.8  3.5 - 5.2 g/dL   AST 22  0 - 37 U/L   ALT 26  0 - 35 U/L   Alkaline Phosphatase 59  39 - 117 U/L   Total Bilirubin 0.2 (*) 0.3 - 1.2 mg/dL   GFR calc non Af Amer 46 (*) >90 mL/min   GFR calc Af Amer 53 (*) >90 mL/min  URINALYSIS, ROUTINE W REFLEX MICROSCOPIC      Result Value Range   Color, Urine YELLOW  YELLOW   APPearance CLOUDY (*) CLEAR   Specific Gravity, Urine 1.034 (*) 1.005 - 1.030   pH 5.5  5.0 - 8.0   Glucose, UA NEGATIVE  NEGATIVE mg/dL   Hgb urine dipstick SMALL (*) NEGATIVE   Bilirubin Urine SMALL (*) NEGATIVE   Ketones, ur NEGATIVE  NEGATIVE mg/dL   Protein, ur 30 (*) NEGATIVE mg/dL   Urobilinogen, UA 1.0  0.0 - 1.0 mg/dL   Nitrite NEGATIVE  NEGATIVE   Leukocytes, UA SMALL (*) NEGATIVE  URINE MICROSCOPIC-ADD ON      Result Value Range   Squamous Epithelial / LPF MANY (*) RARE   WBC, UA 7-10  <3 WBC/hpf   RBC / HPF 3-6  <3 RBC/hpf   Bacteria, UA FEW (*) RARE   Crystals CA OXALATE CRYSTALS (*) NEGATIVE  POCT PREGNANCY, URINE      Result Value Range   Preg Test, Ur NEGATIVE  NEGATIVE   Ct Abdomen Pelvis Wo Contrast  03/31/2013   *RADIOLOGY REPORT*  Clinical Data: Abdominal pain, left flank pain and evidence of mild left hydronephrosis by renal ultrasound.  CT ABDOMEN AND PELVIS WITHOUT CONTRAST  Technique:  Multidetector CT imaging of the abdomen and pelvis was performed following the standard protocol without intravenous contrast.  Comparison: Renal ultrasound earlier today.  Findings: There is no evidence of hydronephrosis by CT.  No renal or ureteral calculi are identified.  The bladder is decompressed and unremarkable in appearance.  Unenhanced appearance of the liver, spleen, pancreas and adrenal glands are within normal limits.  There is a single punctate calcified gallstone in the gallbladder without evidence of gallbladder inflammation or biliary ductal dilatation.  Bowel loops are unremarkable.  No free fluid or abnormal fluid collection  is identified.  Uterus and adnexal regions are unremarkable by CT.  No hernias, masses or enlarged lymph nodes are seen.  Bony structures are unremarkable.  IMPRESSION: No acute findings by CT.  There is no appreciable hydronephrosis by CT and no evidence of renal or ureteral calculi.  A single calcified gallstone is visualized.   Original Report Authenticated By: Irish Lack, M.D.   US Transvaginal Non-ob  03/31/2013   *RADIOLOGY REPORT*  Clinical Data: Left lower quadrant and flank pain.  Evaluate for ovarian torsion.  TRANSABDOMINAL AND TRANSVAGINAL ULTRASOUND OF PELVIS Technique:  Both transabdominal and transvaginal ultrasound examinations of the pelvis were performed. Transabdominal technique was performed for global imaging of the pelvis including uterus, ovaries, adnexal regions, and pelvic cul-de-sac. Color and duplex sonographic imaging was also performed to evaluate blood flow to the ovaries.  It was necessary to proceed with endovaginal exam following the transabdominal exam to visualize the uterus and ovaries to better advantage.  Comparison:  None  Findings:  Uterus: Uterus is normal in size and echogenicity measuring 9.1 cm x 4.8 cm x 6.5 cm.  There  is a hypoechoic 2 cm x 1.9 cm x 2.3 cm fibroid in the posterior upper uterine segment.  This is a mural fibroid.  The uterus is anteverted and mildly anteflexed.  Endometrium: The endometrium is normal in thickness and smooth measuring 5.2 mm.  Right ovary:  Right ovary demonstrates a dominant, 3.7 cm cyst. The ovary measures 39 mm x 22 ml x 28 mm.  There is normal arterial and venous blood flow to the right ovary.  Left ovary: The left ovary demonstrates a 2.4 cm cyst.  The ovary measures 32 mm x 24 mm x 23 mm.  There is normal arterial venous blood flow to the left ovary.  Other findings: No adnexal masses.  No pelvic free fluid.  IMPRESSION: Bilateral, dominant, simple appearing ovarian cysts.  The ovaries are otherwise unremarkable.  There is no  torsion.  2.3 cm uterine fibroid.  No other uterine abnormalities.   Original Report Authenticated By: Amie Portland, M.D.   US Pelvis Complete  03/31/2013   *RADIOLOGY REPORT*  Clinical Data: Left lower quadrant and flank pain.  Evaluate for ovarian torsion.  TRANSABDOMINAL AND TRANSVAGINAL ULTRASOUND OF PELVIS Technique:  Both transabdominal and transvaginal ultrasound examinations of the pelvis were performed. Transabdominal technique was performed for global imaging of the pelvis including uterus, ovaries, adnexal regions, and pelvic cul-de-sac. Color and duplex sonographic imaging was also performed to evaluate blood flow to the ovaries.  It was necessary to proceed with endovaginal exam following the transabdominal exam to visualize the uterus and ovaries to better advantage.  Comparison:  None  Findings:  Uterus: Uterus is normal in size and echogenicity measuring 9.1 cm x 4.8 cm x 6.5 cm.  There is a hypoechoic 2 cm x 1.9 cm x 2.3 cm fibroid in the posterior upper uterine segment.  This is a mural fibroid.  The uterus is anteverted and mildly anteflexed.  Endometrium: The endometrium is normal in thickness and smooth measuring 5.2 mm.  Right ovary:  Right ovary demonstrates a dominant, 3.7 cm cyst. The ovary measures 39 mm x 22 ml x 28 mm.  There is normal arterial and venous blood flow to the right ovary.  Left ovary: The left ovary demonstrates a 2.4 cm cyst.  The ovary measures 32 mm x 24 mm x 23 mm.  There is normal arterial venous blood flow to the left ovary.  Other findings: No adnexal masses.  No pelvic free fluid.  IMPRESSION: Bilateral, dominant, simple appearing ovarian cysts.  The ovaries are otherwise unremarkable.  There is no torsion.  2.3 cm uterine fibroid.  No other uterine abnormalities.   Original Report Authenticated By: Amie Portland, M.D.   US Renal  03/31/2013   *RADIOLOGY REPORT*  Clinical Data: Left flank pain.  Evaluate for hydronephrosis.  RENAL/URINARY TRACT ULTRASOUND  COMPLETE  Comparison:  None.  Findings:  Right Kidney:  Normal in size and echogenicity.  No right renal mass or stone.  No hydronephrosis.  Right kidney measures 10.8 cm in length.  Left Kidney:  There is mild dilation of the left renal pelvis and renal calyces reflecting mild hydronephrosis.  No renal or ureteral stone is seen.  There are no renal masses.  The left kidney measures 12.6 cm in length.  Bladder:  The bladder decompressed not well evaluated.  IMPRESSION: Mild left hydronephrosis.  Given the symptoms of left flank pain, a ureteral stones is suspected, and a follow-up unenhanced CT of the abdomen pelvis is recommended.   Original Report  Authenticated By: Amie Portland, M.D.   [place] Art/ven Flow Abd Pelv Doppler  03/31/2013   *RADIOLOGY REPORT*  Clinical Data: Left lower quadrant and flank pain.  Evaluate for ovarian torsion.  TRANSABDOMINAL AND TRANSVAGINAL ULTRASOUND OF PELVIS Technique:  Both transabdominal and transvaginal ultrasound examinations of the pelvis were performed. Transabdominal technique was performed for global imaging of the pelvis including uterus, ovaries, adnexal regions, and pelvic cul-de-sac. Color and duplex sonographic imaging was also performed to evaluate blood flow to the ovaries.  It was necessary to proceed with endovaginal exam following the transabdominal exam to visualize the uterus and ovaries to better advantage.  Comparison:  None  Findings:  Uterus: Uterus is normal in size and echogenicity measuring 9.1 cm x 4.8 cm x 6.5 cm.  There is a hypoechoic 2 cm x 1.9 cm x 2.3 cm fibroid in the posterior upper uterine segment.  This is a mural fibroid.  The uterus is anteverted and mildly anteflexed.  Endometrium: The endometrium is normal in thickness and smooth measuring 5.2 mm.  Right ovary:  Right ovary demonstrates a dominant, 3.7 cm cyst. The ovary measures 39 mm x 22 ml x 28 mm.  There is normal arterial and venous blood flow to the right ovary.  Left ovary: The left  ovary demonstrates a 2.4 cm cyst.  The ovary measures 32 mm x 24 mm x 23 mm.  There is normal arterial venous blood flow to the left ovary.  Other findings: No adnexal masses.  No pelvic free fluid.  IMPRESSION: Bilateral, dominant, simple appearing ovarian cysts.  The ovaries are otherwise unremarkable.  There is no torsion.  2.3 cm uterine fibroid.  No other uterine abnormalities.   Original Report Authenticated By: Amie Portland, M.D.      1. Kidney stone   2. UTI (lower urinary tract infection)   3. Ovarian cyst       MDM  Patient with left lower quadrant abdominal pain, nausea, and vomiting. Check pelvic exam, urinalysis, and pelvic ultrasound.  9:13 AM Discussed the patient with Dr. Silverio Lay, will order CT to evaluate for stone.  Concern is for septic stone.  Ct is negative, patient states that her pain is controlled. 1 diarrhea and the room, the patient states that she had recently gone to the bathroom, and remarks "I think I just passed the stone."  Will discharge the patient to home with OB/GYN followup. Will give her a prescription of Keflex , and instructions to have her creatinine rechecked. Patient understands and agrees to plan. She is stable and ready for discharge.        Roxy Horseman, PA-C 03/31/13 1049

## 2013-03-31 NOTE — ED Notes (Signed)
PER EMS- pt states that last night around 9pm she began to have LLQ abd pain that radiates to groin area. Vomiting, loose stools. No fever. Pt denies recent strenuous movement or eating something new for dinner. Pt also reports painful, frequent urination, no burning. And states the pain improves when she lays on her side. VS: BP-138/78, HR-70, RR-16.

## 2013-04-01 LAB — URINE CULTURE: Colony Count: >=100000

## 2013-04-01 NOTE — ED Provider Notes (Signed)
Medical screening examination/treatment/procedure(s) were performed by non-physician practitioner and as supervising physician I was immediately available for consultation/collaboration.   Dejanira Pamintuan H Shawntavia Saunders, MD 04/01/13 0658 

## 2013-04-02 LAB — GC/CHLAMYDIA PROBE AMP
CT Probe RNA: POSITIVE — AB
GC Probe RNA: NEGATIVE

## 2013-04-06 NOTE — ED Notes (Signed)
+  Chlamydia Chart sent to EDP office for review.  

## 2013-04-08 ENCOUNTER — Telehealth (HOSPITAL_COMMUNITY): Payer: Self-pay | Admitting: Emergency Medicine

## 2013-04-08 NOTE — ED Notes (Signed)
Pt ID verified x 2.  Pt informed of Dx, need for addl tx, notify partner(s) for testing and tx.  and abstain from sex x 2 wks from tx time.  Rx called to [facility] [phone] and left on VM.

## 2013-06-07 ENCOUNTER — Ambulatory Visit: Payer: Self-pay | Admitting: Internal Medicine

## 2013-06-08 ENCOUNTER — Ambulatory Visit: Payer: Self-pay | Admitting: Internal Medicine

## 2013-06-08 DIAGNOSIS — Z0289 Encounter for other administrative examinations: Secondary | ICD-10-CM

## 2013-08-10 ENCOUNTER — Encounter (HOSPITAL_COMMUNITY): Payer: Self-pay | Admitting: Emergency Medicine

## 2013-08-10 ENCOUNTER — Emergency Department (INDEPENDENT_AMBULATORY_CARE_PROVIDER_SITE_OTHER)
Admission: EM | Admit: 2013-08-10 | Discharge: 2013-08-10 | Disposition: A | Discharge status: Home / Self Care | Payer: Self-pay | Source: Home / Self Care | Attending: Family Medicine | Admitting: Family Medicine

## 2013-08-10 DIAGNOSIS — J069 Acute upper respiratory infection, unspecified: Secondary | ICD-10-CM

## 2013-08-10 LAB — POCT RAPID STREP A: Streptococcus, Group A Screen (Direct): NEGATIVE

## 2013-08-10 MED ORDER — HYDROCOD POLST-CHLORPHEN POLST 10-8 MG/5ML PO LQCR: 5.0000 mL | Freq: Two times a day (BID) | ORAL | Status: DC | PRN

## 2013-08-10 MED ORDER — IPRATROPIUM BROMIDE 0.06 % NA SOLN: 2.0000 | Freq: Four times a day (QID) | NASAL | Status: DC

## 2013-08-10 NOTE — ED Notes (Signed)
C/o sore throat and sob since last Sunday.  States she does have productive cough.  Admits to having asthma.

## 2013-08-10 NOTE — ED Provider Notes (Signed)
CSN: [id number]     Arrival date & time 08/10/13  1006 History   First MD Initiated Contact with Patient 08/10/13 1151     Chief Complaint  Patient presents with  . Sore Throat  . Shortness of Breath   (Consider location/radiation/quality/duration/timing/severity/associated sxs/prior Treatment) Patient is a 27 y.o. female presenting with pharyngitis and shortness of breath. The history is provided by the patient.  Sore Throat This is a new problem. The current episode started more than 2 days ago. The problem has not changed since onset.Associated symptoms include shortness of breath. Pertinent negatives include no chest pain.  Shortness of Breath Associated symptoms: cough   Associated symptoms: no chest pain     Past Medical History  Diagnosis Date  . Asthma    Past Surgical History  Procedure Laterality Date  . Knee surgery     History reviewed. No pertinent family history. History  Substance Use Topics  . Smoking status: Never Smoker   . Smokeless tobacco: Not on file  . Alcohol Use: Yes   OB History   Grav Para Term Preterm Abortions TAB SAB Ect Mult Living                 Review of Systems  Constitutional: Negative.   HENT: Positive for congestion and postnasal drip.   Respiratory: Positive for cough and shortness of breath.   Cardiovascular: Negative for chest pain.  Gastrointestinal: Negative.     Allergies  Review of patient's allergies indicates no known allergies.  Home Medications   Current Outpatient Rx  Name  Route  Sig  Dispense  Refill  . cephALEXin (KEFLEX) 500 MG capsule   Oral   Take 1 capsule (500 mg total) by mouth 4 (four) times daily.   40 capsule   0   . chlorpheniramine-HYDROcodone (TUSSIONEX PENNKINETIC ER) 10-8 MG/5ML LQCR   Oral   Take 5 mLs by mouth every 12 (twelve) hours as needed.   115 mL   0   . diazepam (VALIUM) 5 MG tablet   Oral   Take 1 tablet (5 mg total) by mouth every 6 (six) hours as needed for anxiety.   20  tablet   0   . Doxylamine-DM (VICKS NYQUIL COUGH PO)   Oral   Take 5 mLs by mouth daily as needed.         Marland Kitchen HYDROcodone-acetaminophen (NORCO/VICODIN) 5-325 MG per tablet   Oral   Take 1 tablet by mouth every 6 (six) hours as needed for pain.   15 tablet   0   . hydrocortisone cream 1 %      Apply to affected area 2 times daily   30 g   3   . ibuprofen (ADVIL,MOTRIN) 200 MG tablet   Oral   Take 400 mg by mouth every 6 (six) hours as needed for pain.         . Ibuprofen-Diphenhydramine Cit (MOTRIN PM PO)   Oral   Take 2 tablets by mouth.         Marland Kitchen ipratropium (ATROVENT) 0.06 % nasal spray   Nasal   Place 2 sprays into the nose 4 (four) times daily.   15 mL   1   . naproxen (NAPROSYN) 500 MG tablet   Oral   Take 1 tablet (500 mg total) by mouth 2 (two) times daily.   30 tablet   0    BP 125/68  Pulse 74  Temp(Src) 97.8 F (36.6 C) (Oral)  Resp 18  SpO2 97%  LMP 08/09/2013 Physical Exam  Nursing note and vitals reviewed. Constitutional: She is oriented to person, place, and time. She appears well-developed and well-nourished.  HENT:  Head: Normocephalic.  Right Ear: External ear normal.  Left Ear: External ear normal.  Nose: Mucosal edema and rhinorrhea present.  Mouth/Throat: Oropharynx is clear and moist.  Eyes: Conjunctivae are normal. Pupils are equal, round, and reactive to light.  Neck: Normal range of motion. Neck supple.  Cardiovascular: Normal rate and regular rhythm.   Pulmonary/Chest: Effort normal and breath sounds normal.  Lymphadenopathy:    She has no cervical adenopathy.  Neurological: She is alert and oriented to person, place, and time.  Skin: Skin is warm and dry.    ED Course  Procedures (including critical care time) Labs Review Labs Reviewed  POCT RAPID STREP A ([facility] CARE ONLY)   Imaging Review No results found.    MDM      Linna Hoff, MD 08/10/13 [phone]

## 2013-08-12 LAB — CULTURE, GROUP A STREP

## 2013-08-23 ENCOUNTER — Encounter (HOSPITAL_COMMUNITY): Payer: Self-pay | Admitting: *Deleted

## 2013-08-23 ENCOUNTER — Inpatient Hospital Stay (HOSPITAL_COMMUNITY)
Admission: AD | Admit: 2013-08-23 | Discharge: 2013-08-23 | Disposition: A | Discharge status: Home / Self Care | Payer: Self-pay | Source: Ambulatory Visit | Attending: Obstetrics & Gynecology | Admitting: Obstetrics & Gynecology

## 2013-08-23 DIAGNOSIS — A5901 Trichomonal vulvovaginitis: Secondary | ICD-10-CM

## 2013-08-23 DIAGNOSIS — N912 Amenorrhea, unspecified: Secondary | ICD-10-CM | POA: Insufficient documentation

## 2013-08-23 HISTORY — DX: Calculus of kidney: N20.0

## 2013-08-23 HISTORY — DX: Cardiac arrhythmia, unspecified: I49.9

## 2013-08-23 HISTORY — DX: Chlamydial infection, unspecified: A74.9

## 2013-08-23 LAB — URINALYSIS, ROUTINE W REFLEX MICROSCOPIC
Bilirubin Urine: NEGATIVE
Glucose, UA: NEGATIVE mg/dL
Hgb urine dipstick: NEGATIVE
Ketones, ur: NEGATIVE mg/dL
Nitrite: NEGATIVE
Protein, ur: NEGATIVE mg/dL
Specific Gravity, Urine: 1.025 (ref 1.005–1.030)
Urobilinogen, UA: 0.2 mg/dL (ref 0.0–1.0)
pH: 6 (ref 5.0–8.0)

## 2013-08-23 LAB — WET PREP, GENITAL
Clue Cells Wet Prep HPF POC: NONE SEEN
Yeast Wet Prep HPF POC: NONE SEEN

## 2013-08-23 LAB — POCT PREGNANCY, URINE: Preg Test, Ur: NEGATIVE

## 2013-08-23 LAB — URINE MICROSCOPIC-ADD ON

## 2013-08-23 MED ORDER — FLUCONAZOLE 150 MG PO TABS
150.0000 mg | ORAL_TABLET | Freq: Once | ORAL | Status: AC
  Administered 2013-08-23: 150 mg via ORAL
  Filled 2013-08-23: qty 1

## 2013-08-23 MED ORDER — METRONIDAZOLE 500 MG PO TABS
2000.0000 mg | ORAL_TABLET | Freq: Once | ORAL | Status: AC
  Administered 2013-08-23: 2000 mg via ORAL
  Filled 2013-08-23: qty 4

## 2013-08-23 NOTE — MAU Provider Note (Signed)
History     CSN: [id number]  Arrival date and time: 08/23/13 4098   First Provider Initiated Contact with Patient 08/23/13 1010      No chief complaint on file.  HPI Linda Chen is a 27 y.o. [id number] endorsing no menstrual period since July 2014; she reports increasingly light periods in the 2-3 months before complete amenorrhea and says she initially was happy for this, as she had "awful cramping that started to get better as my monthlies got lighter," but now she is concerned with the total amenorrhea. The patient says she "feels dizzy around when (she) expects a period to be." She is not pregnant by in-house qualitative urine pregnancy screen, but reports an implanon was placed around December 2011 following the birth of her second child. The patient denies urinary symptoms, but does experience "burning" when her urine contacts her labia. She states she has a "thin yellowish stringy discharge" and vaginal odor / itching. 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 She was treated in the emergency room for "kidney stone and chlamydia" 12/2012 and has received no medical care since. [name] has been with one sexual partner for "about a year" and has not had any STD screening since they have been together.   OB History   Grav Para Term Preterm Abortions TAB SAB Ect Mult Living   2 2 2       2       Past Medical History  Diagnosis Date  . Asthma   . Dysrhythmia, cardiac   . Kidney stone   . Chlamydia     Pt reports treated 12/2011; no further contact with that partner    Past  Surgical History  Procedure Laterality Date  . Knee surgery      History reviewed. No pertinent family history.  History  Substance Use Topics  . Smoking status: Never Smoker   . Smokeless tobacco: Not on file  . Alcohol Use: No    Allergies: No Known Allergies  Prescriptions prior to admission  Medication Sig Dispense Refill  . cephALEXin (KEFLEX) 500 MG capsule Take 1 capsule (500 mg total) by mouth 4 (four) times daily.  40 capsule  0  . chlorpheniramine-HYDROcodone (TUSSIONEX PENNKINETIC ER) 10-8 MG/5ML LQCR Take 5 mLs by mouth every 12 (twelve) hours as needed.  115 mL  0  . diazepam (VALIUM) 5 MG tablet Take 1 tablet (5 mg total) by mouth every 6 (six) hours as needed for anxiety.  20 tablet  0  . Doxylamine-DM (VICKS NYQUIL COUGH PO) Take 5 mLs by mouth daily as needed.      Marland Kitchen HYDROcodone-acetaminophen (NORCO/VICODIN) 5-325 MG per tablet Take 1 tablet by mouth every 6 (six) hours as needed for pain.  15 tablet  0  . hydrocortisone cream 1 % Apply to affected area 2 times daily  30 g  3  . ibuprofen (ADVIL,MOTRIN) 200 MG tablet Take 400 mg by mouth every 6 (six) hours as needed for pain.      . Ibuprofen-Diphenhydramine Cit (MOTRIN PM PO) Take 2 tablets by mouth.      Marland Kitchen ipratropium (  ATROVENT) 0.06 % nasal spray Place 2 sprays into the nose 4 (four) times daily.  15 mL  1  . naproxen (NAPROSYN) 500 MG tablet Take 1 tablet (500 mg total) by mouth 2 (two) times daily.  30 tablet  0    Review of Systems  Constitutional: Negative for fever, chills and malaise/fatigue.  Eyes: Negative for blurred vision and double vision.  Respiratory: Positive for shortness of breath (pt reports asthma history; denies current medications, but does experience occasional DOE). Negative for cough and wheezing.   Cardiovascular: Negative for chest pain and orthopnea.  Gastrointestinal: Positive for nausea (accompanies "dizziness" as described in HPI). Negative for vomiting, abdominal pain,  constipation, blood in stool and melena.  Genitourinary: Negative for dysuria, urgency, frequency, hematuria and flank pain.  Skin: Positive for itching (vaginal itching). Negative for rash.  Neurological: Positive for dizziness (see hpi). Negative for tingling, weakness and headaches.  Psychiatric/Behavioral: Negative.    Physical Exam   Blood pressure 87/60, pulse 70, temperature 98.2 F (36.8 C), temperature source Oral, resp. rate 20, height 5\' 9"  (1.753 m), weight 146.603 kg (323 lb 3.2 oz), last menstrual period 04/26/2013.  Physical Exam  Constitutional: She is oriented to person, place, and time. She appears well-developed and well-nourished.  Pt is obese, pleasant, well-groomed, and in no apparent distress. She seems shy when discussing GU concerns.  HENT:  Head: Normocephalic.  Right Ear: External ear normal.  Left Ear: External ear normal.  Eyes: Right eye exhibits no discharge. Left eye exhibits no discharge.  Neck: No JVD present. No tracheal deviation present.  Cardiovascular: Normal rate, regular rhythm and normal heart sounds.  Exam reveals no gallop and no friction rub.   No murmur heard. Respiratory: Effort normal and breath sounds normal. No stridor. No respiratory distress. She has no wheezes. She has no rales. She exhibits no tenderness.  GI: Soft. Bowel sounds are normal. She exhibits no distension and no mass. There is no tenderness. There is no rebound and no guarding.  Pt is obese with marked central adiposity  Genitourinary: Uterus normal. Vaginal discharge found.  Normal female genitalia and anogenital region; no lesions. Patient has thin white / yellow discharge in vaginal canal and surrounding cervix. Os closed. Patient has minimal R adnexal tenderness and positional / mild CMT. No "chandelier sign" on bimanual exam.  Neurological: She is alert and oriented to person, place, and time. She has normal reflexes.  Skin: Skin is warm and dry.  Psychiatric: Her  behavior is normal. Judgment and thought content normal.    MAU Course  Procedures Pelvic and bimanual exam   Results for orders placed during the hospital encounter of 08/23/13 (from the past 24 hour(s))  POCT PREGNANCY, URINE     Status: None   Collection Time    08/23/13 10:08 AM      Result Value Range   Preg Test, Ur NEGATIVE  NEGATIVE  WET PREP, GENITAL     Status: Abnormal   Collection Time    08/23/13 10:26 AM      Result Value Range   Yeast Wet Prep HPF POC NONE SEEN  NONE SEEN   Trich, Wet Prep FEW (*) NONE SEEN   Clue Cells Wet Prep HPF POC NONE SEEN  NONE SEEN   WBC, Wet Prep HPF POC FEW (*) NONE SEEN    MDM Pelvic and bimanual exam findings as documented POC HCG- negative GC/chlamydia pending Flagyl 2 gm for Trich given in MAU and also  Dilfucan 150mg  PO for possible underlying yeast Urinalysis results not available at time of discharge Assessment and Plan  Assessment: Suspected STI or vaginal infection Amenorrhea Secondary to Implanon Device  Plan: -Treat per culture and wet prep findings Trich- treated in MAU with Flagyl 2 gm; partner to be treated Also gave Diflucan 150mg  in MAU -Follow up in [facility] to evaluate whether implanon device needs removal - note sent to clinic to make appt for pt and call -Encourage establishment of primary care provider and medical home -Patient Education and reassurance Pt seen with Jefm Petty, PA and agree with evaluation and management Pamelia Hoit, WHNP-BC Jefm Petty 08/23/2013, 10:43 AM

## 2013-08-23 NOTE — MAU Note (Signed)
Pt states she has not been having periods with her implanon but she has cramping every month.  She had light bleeding since July, no cramping since then.

## 2013-08-23 NOTE — MAU Note (Signed)
Patient states she has had an Implanon since 2011. States she was told she would have periods but has not had a period since July. States she has a vaginal odor and itching, no discharge, no bleeding.

## 2013-08-24 LAB — GC/CHLAMYDIA PROBE AMP
CT Probe RNA: NEGATIVE
GC Probe RNA: NEGATIVE

## 2013-09-13 ENCOUNTER — Ambulatory Visit (INDEPENDENT_AMBULATORY_CARE_PROVIDER_SITE_OTHER): Payer: Self-pay | Admitting: Obstetrics & Gynecology

## 2013-09-13 ENCOUNTER — Encounter: Payer: Self-pay | Admitting: Obstetrics & Gynecology

## 2013-09-13 VITALS — BP 132/76 | HR 82 | Temp 97.2°F | Ht 73.0 in | Wt 322.6 lb

## 2013-09-13 DIAGNOSIS — Z309 Encounter for contraceptive management, unspecified: Secondary | ICD-10-CM

## 2013-09-13 DIAGNOSIS — Z113 Encounter for screening for infections with a predominantly sexual mode of transmission: Secondary | ICD-10-CM

## 2013-09-13 DIAGNOSIS — N760 Acute vaginitis: Secondary | ICD-10-CM

## 2013-09-13 NOTE — Patient Instructions (Signed)
Sexually Transmitted Disease °A sexually transmitted disease (STD) is an infection that is passed from person to person during sexual activity. STDs can be spread by different types of germs (bacteria, viruses, parasites). An STD can be passed through: °· Spit (saliva). °· Semen. °· Blood. °· Mucus from the vagina. °· Pee (urine). °HOME CARE  °· Tell your sex partner(s) that you have an STD. They should be tested and treated. °· Take your medicine (antibiotics) as told. Finish them even if you start to feel better. °· Only take medicines as told by your doctor. °· Rest. °· Eat a healthy diet. Drink enough fluids to keep your pee clear or pale yellow. °· Do not have sex until treatment is finished. You must follow up with your doctor. °· Keep all doctor visits, Pap tests, and blood tests as told by your doctor. °· Only use condoms labeled "latex" and lubricants that wash away with water (water-soluble). Do not use petroleum jelly or oils. °· Avoid alcohol and illegal drugs. °· Get shots (vaccines) for HPV and hepatitis. °· Avoid risky sex behavior that can break the skin. °GET HELP RIGHT AWAY IF: °· You have a fever. °· You have new problems, or your problems get worse. °MAKE SURE YOU: °· Understand these instructions. °· Will watch your condition. °· Will get help right away if you are not doing well or get worse. °Document Released: 11/04/2004 Document Revised: 12/20/2011 Document Reviewed: 03/23/2013 °ExitCare® Patient Information ©2014 [employer]. ° °

## 2013-09-13 NOTE — Progress Notes (Signed)
Patient ID: Linda Chen, female   DOB: April 19, 1986, 27 y.o.   MRN: [id number] Attestation of Attending Supervision of Resident: Evaluation and management procedures were performed by the [facility] Medicine Resident under my supervision.  I have seen and examined the patient, reviewed the resident's note and chart, and I agree with the management and plan.  Anibal Henderson, M.D. 09/13/2013 3:32 PM

## 2013-09-13 NOTE — Progress Notes (Signed)
Pt. States she has had an implanon for 2 years. Bled for the first few months but has not bled since July 2014. States around the time she is supposed to get her period she feels cramping and vertigo. Also states she had trich last month; still have white cloudy discharge with itching.

## 2013-09-13 NOTE — Progress Notes (Signed)
Linda Chen is an 27 y.o. female. Pt is here for f/u from MAU about 2-3 weeks ago now secondary to vaginal discharge, dx at the time with trich and yeast vaginitis.  Had improvement in discharge after taking both Diflucan and Flagyl, but still have some malodorous d/c.  Denies any recent new sexual exposures, but would like to be tested for STDs as she has never been tested for these in the past.    Pertinent Gynecological History: Menses: Nexplanon, LMP 2012 Bleeding: None  Contraception: Nexplanon Sexually transmitted diseases: currently at risk Previous GYN Procedures: None   Last mammogram: NA  Last pap: normal Date: 2012  Past Medical History  Diagnosis Date  . Asthma   . Dysrhythmia, cardiac   . Kidney stone   . Chlamydia     Pt reports treated 12/2012; no further contact with that partner    Past Surgical History  Procedure Laterality Date  . Knee surgery      History reviewed. No pertinent family history.  Social History:  reports that she has never smoked. She has never used smokeless tobacco. She reports that she does not drink alcohol or use illicit drugs.  ROS  Blood pressure 132/76, pulse 82, temperature 97.2 F (36.2 C), temperature source Oral, height 6\' 1"  (1.854 m), weight 322 lb 9.6 oz (146.33 kg), last menstrual period 04/26/2013. Physical Exam  Constitutional: She appears well-developed and well-nourished. No distress.  Cardiovascular: Normal rate and regular rhythm.   GI: Soft. There is no tenderness.  Genitourinary: Vagina normal and uterus normal. Cervix exhibits discharge (whitish, copious ). Cervix exhibits no motion tenderness and no friability. Right adnexum displays no mass. Left adnexum displays no mass.    Assessment/Plan: 1)Screening for STD's - Pt with history of unprotected sex, denies any recent unprotected sex with a new partner over the past 6 months.  Has never been screened for GC/Chlamydia/HIV/Syphilis to her knowledge.  Will go  ahead and screen for these today. 2) Vaginitis - Pt with hx of Trich/yeast in mid-Nov tx at that time.  Has noticed improvement, however, still having some malodorous d/c.  Will send for wet prep. 3) Contraceptive Management - Pt had Implanon placed in 2012 (February approximately), recommend she call to make appointment to have this removed at the 3 yr mark.     Twana First Paulina Fusi, DO of [facility] 09/13/2013, 3:04 PM

## 2013-09-14 LAB — WET PREP, GENITAL
Clue Cells Wet Prep HPF POC: NONE SEEN
Trich, Wet Prep: NONE SEEN
Yeast Wet Prep HPF POC: NONE SEEN

## 2013-09-14 LAB — RPR

## 2013-09-14 LAB — HIV ANTIBODY (ROUTINE TESTING W REFLEX): HIV: NONREACTIVE

## 2013-09-14 LAB — GC/CHLAMYDIA PROBE AMP
CT Probe RNA: NEGATIVE
GC Probe RNA: NEGATIVE

## 2013-10-08 ENCOUNTER — Telehealth: Payer: Self-pay | Admitting: *Deleted

## 2013-10-08 NOTE — Telephone Encounter (Signed)
Pt called nurse line requesting results from current STD testing.

## 2013-10-09 NOTE — Telephone Encounter (Signed)
Pt called provided pt with results of STD testing.  Pt verbalizes understanding.

## 2013-10-09 NOTE — Telephone Encounter (Signed)
Called pt, message left to call to office for results.

## 2013-10-23 ENCOUNTER — Emergency Department (HOSPITAL_COMMUNITY)
Admission: EM | Admit: 2013-10-23 | Discharge: 2013-10-23 | Discharge status: Absent without leave | Payer: Self-pay | Source: Home / Self Care

## 2013-12-07 ENCOUNTER — Emergency Department (HOSPITAL_COMMUNITY)
Admission: EM | Admit: 2013-12-07 | Discharge: 2013-12-07 | Disposition: A | Discharge status: Home / Self Care | Payer: Self-pay

## 2013-12-07 NOTE — ED Notes (Addendum)
Pt presents to nurse first. Pt is A/O x4 and in NAD. Pt states the wait is too long. I offered to obtain an EKG, vitals and triage the patient; the patient states that EMS obtained vitals and an EKG. The patient refused further assessment and treatment. IV removed that was placed via EMS.

## 2014-01-05 ENCOUNTER — Encounter (HOSPITAL_COMMUNITY): Payer: Self-pay | Admitting: Emergency Medicine

## 2014-01-05 ENCOUNTER — Emergency Department (HOSPITAL_COMMUNITY)
Admission: EM | Admit: 2014-01-05 | Discharge: 2014-01-05 | Disposition: A | Discharge status: Home / Self Care | Payer: Self-pay | Attending: Emergency Medicine | Admitting: Emergency Medicine

## 2014-01-05 DIAGNOSIS — Z3202 Encounter for pregnancy test, result negative: Secondary | ICD-10-CM | POA: Insufficient documentation

## 2014-01-05 DIAGNOSIS — R1013 Epigastric pain: Secondary | ICD-10-CM | POA: Insufficient documentation

## 2014-01-05 DIAGNOSIS — Z87442 Personal history of urinary calculi: Secondary | ICD-10-CM | POA: Insufficient documentation

## 2014-01-05 DIAGNOSIS — Z8679 Personal history of other diseases of the circulatory system: Secondary | ICD-10-CM | POA: Insufficient documentation

## 2014-01-05 DIAGNOSIS — Z79899 Other long term (current) drug therapy: Secondary | ICD-10-CM | POA: Insufficient documentation

## 2014-01-05 DIAGNOSIS — Z8742 Personal history of other diseases of the female genital tract: Secondary | ICD-10-CM | POA: Insufficient documentation

## 2014-01-05 DIAGNOSIS — J45909 Unspecified asthma, uncomplicated: Secondary | ICD-10-CM | POA: Insufficient documentation

## 2014-01-05 DIAGNOSIS — L219 Seborrheic dermatitis, unspecified: Secondary | ICD-10-CM | POA: Insufficient documentation

## 2014-01-05 DIAGNOSIS — Z8619 Personal history of other infectious and parasitic diseases: Secondary | ICD-10-CM | POA: Insufficient documentation

## 2014-01-05 DIAGNOSIS — Z8659 Personal history of other mental and behavioral disorders: Secondary | ICD-10-CM | POA: Insufficient documentation

## 2014-01-05 DIAGNOSIS — R112 Nausea with vomiting, unspecified: Secondary | ICD-10-CM | POA: Insufficient documentation

## 2014-01-05 LAB — CBC WITH DIFFERENTIAL/PLATELET
Basophils Absolute: 0 10*3/uL (ref 0.0–0.1)
Basophils Relative: 0 % (ref 0–1)
Eosinophils Absolute: 0.3 10*3/uL (ref 0.0–0.7)
Eosinophils Relative: 4 % (ref 0–5)
HCT: 39.2 % (ref 36.0–46.0)
Hemoglobin: 12.9 g/dL (ref 12.0–15.0)
Lymphocytes Relative: 23 % (ref 12–46)
Lymphs Abs: 1.4 10*3/uL (ref 0.7–4.0)
MCH: 29 pg (ref 26.0–34.0)
MCHC: 32.9 g/dL (ref 30.0–36.0)
MCV: 88.1 fL (ref 78.0–100.0)
Monocytes Absolute: 0.4 10*3/uL (ref 0.1–1.0)
Monocytes Relative: 7 % (ref 3–12)
Neutro Abs: 4 10*3/uL (ref 1.7–7.7)
Neutrophils Relative %: 65 % (ref 43–77)
Platelets: 314 10*3/uL (ref 150–400)
RBC: 4.45 MIL/uL (ref 3.87–5.11)
RDW: 13 % (ref 11.5–15.5)
WBC: 6.1 10*3/uL (ref 4.0–10.5)

## 2014-01-05 LAB — URINE MICROSCOPIC-ADD ON

## 2014-01-05 LAB — URINALYSIS, ROUTINE W REFLEX MICROSCOPIC
Bilirubin Urine: NEGATIVE
Glucose, UA: NEGATIVE mg/dL
Hgb urine dipstick: NEGATIVE
Ketones, ur: NEGATIVE mg/dL
Nitrite: NEGATIVE
Protein, ur: NEGATIVE mg/dL
Specific Gravity, Urine: 1.026 (ref 1.005–1.030)
Urobilinogen, UA: 0.2 mg/dL (ref 0.0–1.0)
pH: 5.5 (ref 5.0–8.0)

## 2014-01-05 LAB — COMPREHENSIVE METABOLIC PANEL
ALT: 23 U/L (ref 0–35)
AST: 20 U/L (ref 0–37)
Albumin: 3.8 g/dL (ref 3.5–5.2)
Alkaline Phosphatase: 58 U/L (ref 39–117)
BUN: 14 mg/dL (ref 6–23)
CO2: 26 mEq/L (ref 19–32)
Calcium: 9.5 mg/dL (ref 8.4–10.5)
Chloride: 101 mEq/L (ref 96–112)
Creatinine, Ser: 1.12 mg/dL — ABNORMAL HIGH (ref 0.50–1.10)
GFR calc Af Amer: 77 mL/min — ABNORMAL LOW (ref >90)
GFR calc non Af Amer: 66 mL/min — ABNORMAL LOW (ref >90)
Glucose, Bld: 90 mg/dL (ref 70–99)
Potassium: 4 mEq/L (ref 3.7–5.3)
Sodium: 140 mEq/L (ref 137–147)
Total Bilirubin: 0.3 mg/dL (ref 0.3–1.2)
Total Protein: 7.9 g/dL (ref 6.0–8.3)

## 2014-01-05 LAB — POC URINE PREG, ED: Preg Test, Ur: NEGATIVE

## 2014-01-05 LAB — LIPASE, BLOOD: Lipase: 21 U/L (ref 11–59)

## 2014-01-05 MED ORDER — PROMETHAZINE HCL 25 MG PO TABS: 25.0000 mg | ORAL_TABLET | Freq: Four times a day (QID) | ORAL | Status: DC | PRN

## 2014-01-05 MED ORDER — PANTOPRAZOLE SODIUM 40 MG PO TBEC
40.0000 mg | DELAYED_RELEASE_TABLET | Freq: Once | ORAL | Status: AC
  Administered 2014-01-05: 40 mg via ORAL
  Filled 2014-01-05: qty 1

## 2014-01-05 MED ORDER — OMEPRAZOLE 20 MG PO CPDR: 20.0000 mg | DELAYED_RELEASE_CAPSULE | Freq: Every day | ORAL | Status: DC

## 2014-01-05 MED ORDER — KETOROLAC TROMETHAMINE 60 MG/2ML IM SOLN
60.0000 mg | Freq: Once | INTRAMUSCULAR | Status: AC
  Administered 2014-01-05: 60 mg via INTRAMUSCULAR
  Filled 2014-01-05: qty 2

## 2014-01-05 MED ORDER — ONDANSETRON 4 MG PO TBDP
8.0000 mg | ORAL_TABLET | Freq: Once | ORAL | Status: AC
  Administered 2014-01-05: 8 mg via ORAL
  Filled 2014-01-05: qty 2

## 2014-01-05 NOTE — Discharge Instructions (Signed)
1. Medications: phenergan, omeprazole, usual home medications 2. Treatment: rest, drink plenty of fluids, advance diet slowly 3. Follow Up: Please followup with the [facility] for lab recheck    Nausea and Vomiting Nausea is a sick feeling that often comes before throwing up (vomiting). Vomiting is a reflex where stomach contents come out of your mouth. Vomiting can cause severe loss of body fluids (dehydration). Children and elderly adults can become dehydrated quickly, especially if they also have diarrhea. Nausea and vomiting are symptoms of a condition or disease. It is important to find the cause of your symptoms. CAUSES   Direct irritation of the stomach lining. This irritation can result from increased acid production (gastroesophageal reflux disease), infection, food poisoning, taking certain medicines (such as nonsteroidal anti-inflammatory drugs), alcohol use, or tobacco use.  Signals from the brain.These signals could be caused by a headache, heat exposure, an inner ear disturbance, increased pressure in the brain from injury, infection, a tumor, or a concussion, pain, emotional stimulus, or metabolic problems.  An obstruction in the gastrointestinal tract (bowel obstruction).  Illnesses such as diabetes, hepatitis, gallbladder problems, appendicitis, kidney problems, cancer, sepsis, atypical symptoms of a heart attack, or eating disorders.  Medical treatments such as chemotherapy and radiation.  Receiving medicine that makes you sleep (general anesthetic) during surgery. DIAGNOSIS Your caregiver may ask for tests to be done if the problems do not improve after a few days. Tests may also be done if symptoms are severe or if the reason for the nausea and vomiting is not clear. Tests may include:  Urine tests.  Blood tests.  Stool tests.  Cultures (to look for evidence of infection).  X-rays or other imaging studies. Test results can help your caregiver make  decisions about treatment or the need for additional tests. TREATMENT You need to stay well hydrated. Drink frequently but in small amounts.You may wish to drink water, sports drinks, clear broth, or eat frozen ice pops or gelatin dessert to help stay hydrated.When you eat, eating slowly may help prevent nausea.There are also some antinausea medicines that may help prevent nausea. HOME CARE INSTRUCTIONS   Take all medicine as directed by your caregiver.  If you do not have an appetite, do not force yourself to eat. However, you must continue to drink fluids.  If you have an appetite, eat a normal diet unless your caregiver tells you differently.  Eat a variety of complex carbohydrates (rice, wheat, potatoes, bread), lean meats, yogurt, fruits, and vegetables.  Avoid high-fat foods because they are more difficult to digest.  Drink enough water and fluids to keep your urine clear or pale yellow.  If you are dehydrated, ask your caregiver for specific rehydration instructions. Signs of dehydration may include:  Severe thirst.  Dry lips and mouth.  Dizziness.  Dark urine.  Decreasing urine frequency and amount.  Confusion.  Rapid breathing or pulse. SEEK IMMEDIATE MEDICAL CARE IF:   You have blood or brown flecks (like coffee grounds) in your vomit.  You have black or bloody stools.  You have a severe headache or stiff neck.  You are confused.  You have severe abdominal pain.  You have chest pain or trouble breathing.  You do not urinate at least once every 8 hours.  You develop cold or clammy skin.  You continue to vomit for longer than 24 to 48 hours.  You have a fever. MAKE SURE YOU:   Understand these instructions.  Will watch your condition.  Will  get help right away if you are not doing well or get worse. Document Released: 09/27/2005 Document Revised: 12/20/2011 Document Reviewed: 02/24/2011 [facility]ExitCare Patient Information 2014 [place],  [place].   Seborrheic Dermatitis Seborrheic dermatitis involves pink or red skin with greasy, flaky scales. This is often found on the scalp, eyebrows, nose, bearded area, and on or behind the ears. It can also occur on the central chest. It often occurs where there are more oil (sebaceous) glands. This condition is also known as dandruff. When this condition affects a baby's scalp, it is called cradle cap. It may come and go for no known reason. It can occur at any time of life from infancy to old age. CAUSES  The cause is unknown. It is not the result of too little moisture or too much oil. In some people, seborrheic dermatitis flare-ups seem to be triggered by stress. It also commonly occurs in people with certain diseases such as Parkinson's disease or HIV/AIDS. SYMPTOMS   Thick scales on the scalp.  Redness on the face or in the armpits.  The skin may seem oily or dry, but moisturizers do not help.  In infants, seborrheic dermatitis appears as scaly redness that does not seem to bother the baby. In some babies, it affects only the scalp. In others, it also affects the neck creases, armpits, groin, or behind the ears.  In adults and adolescents, seborrheic dermatitis may affect only the scalp. It may look patchy or spread out, with areas of redness and flaking. Other areas commonly affected include:  Eyebrows.  Eyelids.  Forehead.  Skin behind the ears.  Outer ears.  Chest.  Armpits.  Nose creases.  Skin creases under the breasts.  Skin between the buttocks.  Groin.  Some adults and adolescents feel itching or burning in the affected areas. DIAGNOSIS  Your caregiver can usually tell what the problem is by doing a physical exam. TREATMENT   Cortisone (steroid) ointments, creams, and lotions can help decrease inflammation.  Babies can be treated with baby oil to soften the scales, then they may be washed with baby shampoo. If this does not help, a prescription topical  steroid medicine may work.  Adults can use medicated shampoos.  Your caregiver may prescribe corticosteroid cream and shampoo containing an antifungal or yeast medicine (ketoconazole). Hydrocortisone or anti-yeast cream can be rubbed directly onto seborrheic dermatitis patches. Yeast does not cause seborrheic dermatitis, but it seems to add to the problem. In infants, seborrheic dermatitis is often worst during the first year of life. It tends to disappear on its own as the child grows. However, it may return during the teenage years. In adults and adolescents, seborrheic dermatitis tends to be a long-lasting condition that comes and goes over many years. HOME CARE INSTRUCTIONS   Use prescribed medicines as directed.  In infants, do not aggressively remove the scales or flakes on the scalp with a comb or by other means. This may lead to hair loss. SEEK MEDICAL CARE IF:   The problem does not improve from the medicated shampoos, lotions, or other medicines given by your caregiver.  You have any other questions or concerns. Document Released: 09/27/2005 Document Revised: 03/28/2012 Document Reviewed: 02/16/2010 [facility]Exit[facility] Patient Information 2014 [place] CreekExitCare, [place]LLC.

## 2014-01-05 NOTE — ED Provider Notes (Signed)
CSN: [id number]     Arrival date & time 01/05/14  1459 History   First MD Initiated Contact with Patient 01/05/14 1544     Chief Complaint  Patient presents with  . Abdominal Pain     (Consider location/radiation/quality/duration/timing/severity/associated sxs/prior Treatment) The history is provided by the patient and medical records. No language interpreter was used.    Linda ChestnutSynethia R Chen is a 28 y.o. female  with a hx of asthma, panic attack, ovarian cysts presents to the Emergency Department complaining of gradual, persistent, progressively worsening epigastric cramping abdominal pain which radiates into the sides with associated emesis x2 onset this AM at 5:30 while at work. Emesis was NBNB.  Pt denies diarrhea. Pt did not try any OTC treatments for her symptoms. Standing up makes it better and bending over makes it worse.  Pt denies fever, chills, headache, neck pain, chest pain, SOB, diarrhea, weakness, dizziness, syncope, dysuria, hematuria.  Pt reports feeling emotionally tired for several years due to some family stress.  Pt denies feeling SI/HI, auditory or visual hallucinations. Pt reports she has 2 kids a much reason to live.  Pt denies vaginal discharge, vaginal bleeding.  Her last BM was this AM and normal.  LMP 3 weeks ago.     Past Medical History  Diagnosis Date  . Asthma   . Dysrhythmia, cardiac   . Kidney stone   . Chlamydia     Pt reports treated 12/2012; no further contact with that partner   Past Surgical History  Procedure Laterality Date  . Knee surgery     History reviewed. No pertinent family history. History  Substance Use Topics  . Smoking status: Never Smoker   . Smokeless tobacco: Never Used  . Alcohol Use: No   OB History   Grav Para Term Preterm Abortions TAB SAB Ect Mult Living   2 2 2       2      Review of Systems  Constitutional: Negative for fever, diaphoresis, appetite change, fatigue and unexpected weight change.  HENT: Negative for mouth  sores and trouble swallowing.   Respiratory: Negative for cough, chest tightness, shortness of breath, wheezing and stridor.   Cardiovascular: Negative for chest pain and palpitations.  Gastrointestinal: Positive for nausea, vomiting (x2) and abdominal pain (epigastric). Negative for diarrhea, constipation, blood in stool, abdominal distention and rectal pain.  Genitourinary: Negative for dysuria, urgency, frequency, hematuria, flank pain and difficulty urinating.  Musculoskeletal: Negative for back pain, neck pain and neck stiffness.  Skin: Negative for rash.  Neurological: Negative for weakness.  Hematological: Negative for adenopathy.  Psychiatric/Behavioral: Negative for confusion.  All other systems reviewed and are negative.      Allergies  Review of patient's allergies indicates no known allergies.  Home Medications   Current Outpatient Rx  Name  Route  Sig  Dispense  Refill  . Multiple Vitamin (MULTIVITAMIN WITH MINERALS) TABS tablet   Oral   Take 1 tablet by mouth daily.         Marland Kitchen. omeprazole (PRILOSEC) 20 MG capsule   Oral   Take 1 capsule (20 mg total) by mouth daily.   30 capsule   0   . promethazine (PHENERGAN) 25 MG tablet   Oral   Take 1 tablet (25 mg total) by mouth every 6 (six) hours as needed for nausea or vomiting.   12 tablet   0    BP 115/47  Pulse 61  Temp(Src) 97.7 F (36.5 C) (Oral)  Resp  16  Ht 6\' 1"  (1.854 m)  Wt 310 lb (140.615 kg)  BMI 40.91 kg/m2  SpO2 99% Physical Exam  Nursing note and vitals reviewed. Constitutional: She is oriented to person, place, and time. She appears well-developed and well-nourished. No distress.  Awake, alert, nontoxic appearance  HENT:  Head: Normocephalic and atraumatic.  Mouth/Throat: Oropharynx is clear and moist. No oropharyngeal exudate.  Moist mucous membranes  Eyes: Conjunctivae are normal. No scleral icterus.  Neck: Normal range of motion. Neck supple.  Cardiovascular: Normal rate, regular  rhythm, normal heart sounds and intact distal pulses.   No murmur heard. No tachycardia  Pulmonary/Chest: Effort normal and breath sounds normal. No respiratory distress. She has no wheezes.  Clear and equal breath sounds  Abdominal: Soft. Bowel sounds are normal. She exhibits no distension and no mass. There is no tenderness. There is no rigidity, no rebound, no guarding and no CVA tenderness.  Abd obese but soft and nontender  Musculoskeletal: Normal range of motion. She exhibits no edema.  Neurological: She is alert and oriented to person, place, and time.  Speech is clear and goal oriented Moves extremities without ataxia  Skin: Skin is warm and dry. She is not diaphoretic. No erythema.  Psychiatric: She has a normal mood and affect.    ED Course  Procedures (including critical care time) Labs Review Labs Reviewed  COMPREHENSIVE METABOLIC PANEL - Abnormal; Notable for the following:    Creatinine, Ser 1.12 (*)    GFR calc non Af Amer 66 (*)    GFR calc Af Amer 77 (*)    All other components within normal limits  URINALYSIS, ROUTINE W REFLEX MICROSCOPIC - Abnormal; Notable for the following:    APPearance CLOUDY (*)    Leukocytes, UA SMALL (*)    All other components within normal limits  URINE MICROSCOPIC-ADD ON - Abnormal; Notable for the following:    Squamous Epithelial / LPF MANY (*)    All other components within normal limits  CBC WITH DIFFERENTIAL  LIPASE, BLOOD  POC URINE PREG, ED   Imaging Review No results found.   EKG Interpretation None      MDM   Final diagnoses:  Nausea and vomiting  Epigastric abdominal pain  Dermatitis, seborrheic   Linda Chen presents with c/o epigastric abd pain with 2 episodes of N/V but resolved nausea and no vomiting since 11am.  She is alert, oriented, nontoxic and nonseptic appearing, in NAD.  She has moist mucous membranes and dose not exhibit signs of dehydration.  She is afebrile and nontachycardic.  Low suspicion  for   4:57 PM Pt reports feeling much better and has been able to tolerate PO in the department without further emesis.  Patient is nontoxic, nonseptic appearing, in no apparent distress.  Patient's pain and other symptoms adequately managed in emergency department.  Pt without signs of dehydration.  Labs and vitals reviewed.  Patient does not meet the SIRS or Sepsis criteria.  On repeat exam patient does not have a surgical abdomin and there are no peritoneal signs.  No indication of appendicitis, bowel obstruction, bowel perforation, cholecystitis, diverticulitis, PID or ectopic pregnancy.  Patient discharged home with symptomatic treatment and given strict instructions for follow-up with their primary care physician.  I have also discussed reasons to return immediately to the ER.  Patient expresses understanding and agrees with plan.  It has been determined that no acute conditions requiring further emergency intervention are present at this time. The patient/guardian  have been advised of the diagnosis and plan. We have discussed signs and symptoms that warrant return to the ED, such as changes or worsening in symptoms.   Vital signs are stable at discharge.   BP 115/47  Pulse 61  Temp(Src) 97.7 F (36.5 C) (Oral)  Resp 16  Ht 6\' 1"  (1.854 m)  Wt 310 lb (140.615 kg)  BMI 40.91 kg/m2  SpO2 99%  Patient/guardian has voiced understanding and agreed to follow-up with the PCP or specialist.          Dierdre Forth, PA-C 01/05/14 2131

## 2014-01-05 NOTE — ED Notes (Signed)
She c/o n/v and abd pain since yesterday am.

## 2014-01-06 NOTE — ED Provider Notes (Signed)
Medical screening examination/treatment/procedure(s) were performed by non-physician practitioner and as supervising physician I was immediately available for consultation/collaboration.   Jakala Herford L Teiana Hajduk, MD 01/06/14 1249 

## 2014-04-09 ENCOUNTER — Encounter (HOSPITAL_COMMUNITY): Payer: Self-pay | Admitting: *Deleted

## 2014-04-09 ENCOUNTER — Inpatient Hospital Stay (HOSPITAL_COMMUNITY)
Admission: AD | Admit: 2014-04-09 | Discharge: 2014-04-09 | Disposition: A | Discharge status: Home / Self Care | Payer: Self-pay | Source: Ambulatory Visit | Attending: Obstetrics & Gynecology | Admitting: Obstetrics & Gynecology

## 2014-04-09 DIAGNOSIS — A599 Trichomoniasis, unspecified: Secondary | ICD-10-CM

## 2014-04-09 DIAGNOSIS — L293 Anogenital pruritus, unspecified: Secondary | ICD-10-CM | POA: Insufficient documentation

## 2014-04-09 DIAGNOSIS — A5901 Trichomonal vulvovaginitis: Secondary | ICD-10-CM | POA: Insufficient documentation

## 2014-04-09 LAB — URINALYSIS, ROUTINE W REFLEX MICROSCOPIC
Bilirubin Urine: NEGATIVE
Glucose, UA: NEGATIVE mg/dL
Ketones, ur: NEGATIVE mg/dL
Nitrite: NEGATIVE
Protein, ur: NEGATIVE mg/dL
Specific Gravity, Urine: 1.025 (ref 1.005–1.030)
Urobilinogen, UA: 0.2 mg/dL (ref 0.0–1.0)
pH: 6 (ref 5.0–8.0)

## 2014-04-09 LAB — URINE MICROSCOPIC-ADD ON

## 2014-04-09 LAB — WET PREP, GENITAL: Yeast Wet Prep HPF POC: NONE SEEN

## 2014-04-09 LAB — RPR

## 2014-04-09 LAB — HIV ANTIBODY (ROUTINE TESTING W REFLEX): HIV 1&2 Ab, 4th Generation: NONREACTIVE

## 2014-04-09 LAB — POCT PREGNANCY, URINE: Preg Test, Ur: NEGATIVE

## 2014-04-09 MED ORDER — METRONIDAZOLE 500 MG PO TABS
2000.0000 mg | ORAL_TABLET | Freq: Once | ORAL | Status: AC
Start: 2014-04-09 — End: 2014-04-09
  Administered 2014-04-09: 2000 mg via ORAL
  Filled 2014-04-09: qty 4

## 2014-04-09 NOTE — MAU Note (Signed)
Pt states she has had vaginal discharge and yeast infections often. Pt states she has been informed by "the person she has been sleeping with that he had trich" pt states she has not slept with him for about 3 months

## 2014-04-09 NOTE — MAU Note (Signed)
D/c and vaginal itching- recurrent problem.  Partner notified her he was + for trich.

## 2014-04-09 NOTE — Progress Notes (Signed)
Pt states she is  having itching. Pt states she is not having any symptoms of trich.

## 2014-04-09 NOTE — MAU Provider Note (Signed)
History     CSN: [id number]  Arrival date and time: 04/09/14 1443   First Provider Initiated Contact with Patient 04/09/14 1520      Chief Complaint  Patient presents with  . Vaginal Itching   HPI  Linda Chen is a 28 y.o. female who presents to be evaluated for exposure to trichomonas.  Partner recently diagnosed & treated for trich. Has 1 female partner; been with since 10/2013.Partner was diagnosed in May; pt last had intercourse with him the beginning of May. Currently denies vaginal discharge, itching or irritation. States gets yeast infections frequently (monthly) that she self diagnoses/treats. Symptoms are milky white discharge & vaginal irritation. Denies vaginal odor.  Denies vaginal bleeding.  Denies abdominal pain.  Denies fevers.  Past Medical History  Diagnosis Date  . Asthma   . Dysrhythmia, cardiac   . Kidney stone   . Chlamydia     Pt reports treated 12/2012; no further contact with that partner    Past Surgical History  Procedure Laterality Date  . Knee surgery      Family History  Problem Relation Age of Onset  . Stroke Mother   . Gout Father     History  Substance Use Topics  . Smoking status: Never Smoker   . Smokeless tobacco: Never Used  . Alcohol Use: No    Allergies: No Known Allergies  Prescriptions prior to admission  Medication Sig Dispense Refill  . Etonogestrel (IMPLANON [place]) Inject 1 each into the skin once. Placed in 2011.      . ibuprofen (ADVIL,MOTRIN) 800 MG tablet Take 800 mg by mouth every 8 (eight) hours as needed for headache.      . Multiple Vitamin (MULTIVITAMIN WITH MINERALS) TABS tablet Take 1 tablet by mouth daily.        Review of Systems  Constitutional: Negative.   Gastrointestinal: Negative.   Genitourinary: Negative.        Negative for vaginal itching/irritation/discharge/bleeding.    Physical Exam   Blood pressure 128/78, pulse 78, temperature 100 F (37.8 C), temperature source Oral, resp. rate 18,  height 6' (1.829 m), weight 302 lb 12.8 oz (137.349 kg), last menstrual period 03/09/2014, SpO2 97.00%.  Physical Exam  Constitutional: She is oriented to person, place, and time. She appears well-developed and well-nourished. No distress.  HENT:  Head: Normocephalic.  Cardiovascular: Normal rate.   Respiratory: Effort normal.  GI: Soft.  Genitourinary: Uterus normal. Cervix exhibits friability. Cervix exhibits no motion tenderness and no discharge. Right adnexum displays no mass and no tenderness. Left adnexum displays no mass and no tenderness. No erythema or bleeding around the vagina. Vaginal discharge found.  Moderate amount of thin off-white discharge in vaginal vault.   Neurological: She is alert and oriented to person, place, and time.  Skin: Skin is warm and dry. She is not diaphoretic. No erythema.  Psychiatric: She has a normal mood and affect. Her behavior is normal. Judgment and thought content normal.    MAU Course  Procedures Results for orders placed during the hospital encounter of 04/09/14 (from the past 24 hour(s))  URINALYSIS, ROUTINE W REFLEX MICROSCOPIC     Status: Abnormal   Collection Time    04/09/14  2:52 PM      Result Value Ref Range   Color, Urine YELLOW  YELLOW   APPearance CLEAR  CLEAR   Specific Gravity, Urine 1.025  1.005 - 1.030   pH 6.0  5.0 - 8.0   Glucose, UA NEGATIVE  NEGATIVE mg/dL   Hgb urine dipstick TRACE (*) NEGATIVE   Bilirubin Urine NEGATIVE  NEGATIVE   Ketones, ur NEGATIVE  NEGATIVE mg/dL   Protein, ur NEGATIVE  NEGATIVE mg/dL   Urobilinogen, UA 0.2  0.0 - 1.0 mg/dL   Nitrite NEGATIVE  NEGATIVE   Leukocytes, UA SMALL (*) NEGATIVE  URINE MICROSCOPIC-ADD ON     Status: Abnormal   Collection Time    04/09/14  2:52 PM      Result Value Ref Range   Squamous Epithelial / LPF FEW (*) RARE   WBC, UA 0-2  <3 WBC/hpf   RBC / HPF 3-6  <3 RBC/hpf   Bacteria, UA FEW (*) RARE   Urine-Other MUCOUS PRESENT    POCT PREGNANCY, URINE     Status:  None   Collection Time    04/09/14  3:06 PM      Result Value Ref Range   Preg Test, Ur NEGATIVE  NEGATIVE  WET PREP, GENITAL     Status: Abnormal   Collection Time    04/09/14  3:50 PM      Result Value Ref Range   Yeast Wet Prep HPF POC NONE SEEN  NONE SEEN   Trich, Wet Prep MODERATE (*) NONE SEEN   Clue Cells Wet Prep HPF POC FEW (*) NONE SEEN   WBC, Wet Prep HPF POC MODERATE (*) NONE SEEN    MDM Flagyl given while in MAU  Assessment and Plan  A: 1. Trichomonas infection    P: Discharge in stable condition.  GC/chlamydia, HIV, & RPR pending Discussed safe sex practices.  Partner has already been treated; has not had intercourse with him since treatment.   Linda Chen, Student-NP  04/09/2014, 4:33 PM   I have seen and evaluated the patient with the NP/PA/Med student. I agree with the assessment and plan as written above.   Freddi StarrJulie N Ethier, PA-C  04/09/2014 4:55 PM

## 2014-04-09 NOTE — Discharge Instructions (Signed)
Trichomoniasis °Trichomoniasis is an infection caused by an organism called Trichomonas. The infection can affect both women and men. In women, the outer female genitalia and the vagina are affected. In men, the penis is mainly affected, but the prostate and other reproductive organs can also be involved. Trichomoniasis is a sexually transmitted infection (STI) and is most often passed to another person through sexual contact.  °RISK FACTORS °· Having unprotected sexual intercourse. °· Having sexual intercourse with an infected partner. °SIGNS AND SYMPTOMS  °Symptoms of trichomoniasis in women include: °· Abnormal gray-green frothy vaginal discharge. °· Itching and irritation of the vagina. °· Itching and irritation of the area outside the vagina. °Symptoms of trichomoniasis in men include:  °· Penile discharge with or without pain. °· Pain during urination. This results from inflammation of the urethra. °DIAGNOSIS  °Trichomoniasis may be found during a Pap test or physical exam. Your health care provider may use one of the following methods to help diagnose this infection: °· Examining vaginal discharge under a microscope. For men, urethral discharge would be examined. °· Testing the pH of the vagina with a test tape. °· Using a vaginal swab test that checks for the Trichomonas organism. A test is available that provides results within a few minutes. °· Doing a culture test for the organism. This is not usually needed. °TREATMENT  °· You may be given medicine to fight the infection. Women should inform their health care provider if they could be or are pregnant. Some medicines used to treat the infection should not be taken during pregnancy. °· Your health care provider may recommend over-the-counter medicines or creams to decrease itching or irritation. °· Your sexual partner will need to be treated if infected. °HOME CARE INSTRUCTIONS  °· Take all medicine prescribed by your health care provider. °· Take  over-the-counter medicine for itching or irritation as directed by your health care provider. °· Do not have sexual intercourse while you have the infection. °· Women should not douche or wear tampons while they have the infection. °· Discuss your infection with your partner. Your partner may have gotten the infection from you, or you may have gotten it from your partner. °· Have your sex partner get examined and treated if necessary. °· Practice safe, informed, and protected sex. °· See your health care provider for other STI testing. °SEEK MEDICAL CARE IF:  °· You still have symptoms after you finish your medicine. °· You develop abdominal pain. °· You have pain when you urinate. °· You have bleeding after sexual intercourse. °· You develop a rash. °· Your medicine makes you sick or makes you throw up (vomit). °Document Released: 03/23/2001 Document Revised: 10/02/2013 Document Reviewed: 07/09/2013 °Exit[employer]® Patient Information ©2015 [employer]. This information is not intended to replace advice given to you by your health care provider. Make sure you discuss any questions you have with your health care provider. ° °

## 2014-04-10 LAB — GC/CHLAMYDIA PROBE AMP
CT Probe RNA: NEGATIVE
GC Probe RNA: NEGATIVE

## 2014-04-10 NOTE — MAU Note (Signed)
Pt here, requesting reprint of d/c  instructions

## 2014-05-14 ENCOUNTER — Inpatient Hospital Stay (HOSPITAL_COMMUNITY)
Admission: AD | Admit: 2014-05-14 | Discharge: 2014-05-14 | Discharge status: Against Medical Advice | Payer: Self-pay | Source: Ambulatory Visit | Attending: Obstetrics & Gynecology | Admitting: Obstetrics & Gynecology

## 2014-05-14 DIAGNOSIS — R110 Nausea: Secondary | ICD-10-CM | POA: Insufficient documentation

## 2014-05-14 DIAGNOSIS — Z3202 Encounter for pregnancy test, result negative: Secondary | ICD-10-CM | POA: Insufficient documentation

## 2014-05-14 LAB — URINALYSIS, ROUTINE W REFLEX MICROSCOPIC
Bilirubin Urine: NEGATIVE
Glucose, UA: NEGATIVE mg/dL
Hgb urine dipstick: NEGATIVE
Ketones, ur: NEGATIVE mg/dL
Leukocytes, UA: NEGATIVE
Nitrite: NEGATIVE
Protein, ur: NEGATIVE mg/dL
Specific Gravity, Urine: 1.02 (ref 1.005–1.030)
Urobilinogen, UA: 0.2 mg/dL (ref 0.0–1.0)
pH: 6 (ref 5.0–8.0)

## 2014-05-14 LAB — POCT PREGNANCY, URINE: Preg Test, Ur: NEGATIVE

## 2014-05-14 NOTE — MAU Note (Signed)
Pt has implanon, placed 4 years ago, pt thinks it needs to come out.  Has been having pregnancy sx's, queasy, nauseated, vomited this a.m.  LMP 7/1.  Has not done HPT.

## 2014-05-14 NOTE — Progress Notes (Signed)
Pt returns to MAU after going to clinic and making an appointment.  Declines being seen in MAU today. Requested results of pregnancy test - negative.

## 2014-05-14 NOTE — MAU Note (Signed)
Urine in lab 

## 2014-05-14 NOTE — MAU Note (Signed)
Patient called from lobby; not present. Per registration [facility] and states will be back.

## 2014-05-14 NOTE — MAU Note (Signed)
Patient returned from clinic and declines being seen in MAU. Risk and Benefits explained to patient; patient signe out AMA. Provider made aware.

## 2014-06-07 ENCOUNTER — Emergency Department (HOSPITAL_COMMUNITY)
Admission: EM | Admit: 2014-06-07 | Discharge: 2014-06-07 | Disposition: A | Discharge status: Home / Self Care | Payer: Self-pay | Attending: Emergency Medicine | Admitting: Emergency Medicine

## 2014-06-07 ENCOUNTER — Emergency Department (HOSPITAL_COMMUNITY): Payer: Self-pay

## 2014-06-07 ENCOUNTER — Encounter (HOSPITAL_COMMUNITY): Payer: Self-pay | Admitting: Emergency Medicine

## 2014-06-07 DIAGNOSIS — Z8679 Personal history of other diseases of the circulatory system: Secondary | ICD-10-CM | POA: Insufficient documentation

## 2014-06-07 DIAGNOSIS — M25561 Pain in right knee: Secondary | ICD-10-CM

## 2014-06-07 DIAGNOSIS — M25569 Pain in unspecified knee: Secondary | ICD-10-CM | POA: Insufficient documentation

## 2014-06-07 DIAGNOSIS — J45909 Unspecified asthma, uncomplicated: Secondary | ICD-10-CM | POA: Insufficient documentation

## 2014-06-07 DIAGNOSIS — Z79899 Other long term (current) drug therapy: Secondary | ICD-10-CM | POA: Insufficient documentation

## 2014-06-07 DIAGNOSIS — Z87442 Personal history of urinary calculi: Secondary | ICD-10-CM | POA: Insufficient documentation

## 2014-06-07 DIAGNOSIS — Z8619 Personal history of other infectious and parasitic diseases: Secondary | ICD-10-CM | POA: Insufficient documentation

## 2014-06-07 MED ORDER — MELOXICAM 15 MG PO TABS: 15.0000 mg | ORAL_TABLET | Freq: Every day | ORAL | Status: DC

## 2014-06-07 NOTE — ED Provider Notes (Signed)
Medical screening examination/treatment/procedure(s) were performed by non-physician practitioner and as supervising physician I was immediately available for consultation/collaboration.   EKG Interpretation None        Glynn Octave, MD 06/07/14 [phone]

## 2014-06-07 NOTE — ED Notes (Signed)
Pt sts right sided knee pain starting yesterday; pt sts hx of sx to right knee in 2001; pt denies obvious new injury and sts stiff to move today

## 2014-06-07 NOTE — ED Provider Notes (Signed)
CSN: [id number]     Arrival date & time 06/07/14  1157 History  This chart was scribed for non-physician practitioner, Arthor Captain, PA-C working with Glynn Octave, MD by Greggory Stallion, ED scribe. This patient was seen in room TR07C/TR07C and the patient's care was started at 12:18 PM.   Chief Complaint  Patient presents with  . Knee Pain   The history is provided by the patient. No language interpreter was used.   HPI Comments: Linda Chen is a 28 y.o. female who presents to the Emergency Department complaining of gradual onset right knee pain that started yesterday. Denies new injury but states she has a history of right knee problems due to patellar dislocation. States Dr. Rennis Chris with [facility] has done surgery on the knee. Bearing weight worsens the pain. Pt has taken naproxen with no relief.   Past Medical History  Diagnosis Date  . Asthma   . Dysrhythmia, cardiac   . Kidney stone   . Chlamydia     Pt reports treated 12/2012; no further contact with that partner   Past Surgical History  Procedure Laterality Date  . Knee surgery     Family History  Problem Relation Age of Onset  . Stroke Mother   . Gout Father    History  Substance Use Topics  . Smoking status: Never Smoker   . Smokeless tobacco: Never Used  . Alcohol Use: No   OB History   Grav Para Term Preterm Abortions TAB SAB Ect Mult Living   Review of Systems  Constitutional: Negative for fever.  HENT: Negative for congestion.   Eyes: Negative for redness.  Respiratory: Negative for shortness of breath.   Cardiovascular: Negative for chest pain.  Gastrointestinal: Negative for abdominal distention.  Musculoskeletal: Positive for arthralgias.  Skin: Negative for rash.  Neurological: Negative for speech difficulty.  Psychiatric/Behavioral: Negative for confusion.   Allergies  Review of patient's allergies indicates no known allergies.  Home Medications   Prior  to Admission medications   Medication Sig Start Date End Date Taking? Authorizing Provider  Etonogestrel (IMPLANON [place]) Inject 1 each into the skin once. Placed in 2011.    Historical Provider, MD  ibuprofen (ADVIL,MOTRIN) 800 MG tablet Take 800 mg by mouth every 8 (eight) hours as needed for headache.    Historical Provider, MD  Multiple Vitamin (MULTIVITAMIN WITH MINERALS) TABS tablet Take 1 tablet by mouth daily.    Historical Provider, MD   BP 101/64  Pulse 92  Temp(Src) 98.3 F (36.8 C) (Oral)  Resp 22  Ht  (1.854 m)  Wt 308 lb (139.708 kg)  BMI 40.64 kg/m2  SpO2 96%  LMP 04/10/2014  Physical Exam  Nursing note and vitals reviewed. Constitutional: She is oriented to person, place, and time. She appears well-developed and well-nourished. No distress.  HENT:  Head: Normocephalic and atraumatic.  Eyes: Conjunctivae and EOM are normal.  Neck: Neck supple. No tracheal deviation present.  Cardiovascular: Normal rate.   Pulmonary/Chest: Effort normal. No respiratory distress.  Musculoskeletal: Normal range of motion.  Minimal amount of crepitus in right knee. Full ROM.  Neurological: She is alert and oriented to person, place, and time.  Skin: Skin is warm and dry.  Psychiatric: She has a normal mood and affect. Her behavior is normal.    ED Course  Procedures (including critical care time)  DIAGNOSTIC STUDIES: Oxygen Saturation is 96% on  RA, normal by my interpretation.    COORDINATION OF CARE: 12:29 PM-Discussed treatment plan which includes xray and an anti-inflammatory with pt at bedside and pt agreed to plan. Advised pt to follow up with Dr. Rennis Chris.   Labs Review Labs Reviewed - No data to display  Imaging Review Dg Knee Complete 4 Views Right  06/07/2014   CLINICAL DATA:  Right knee pain with swelling beginning this morning with no injury, right knee surgery about 13 years ago  EXAM: RIGHT KNEE - COMPLETE 4+ VIEW  COMPARISON:  None.  FINDINGS: Evidence of  anterior cruciate ligament repair. Mild narrowing and osteophyte of both the medial and lateral compartments with minimal osteophyte formation in the patellofemoral compartment. No fracture or dislocation. No definite joint effusion.  IMPRESSION: Postoperative change and arthritis with no acute findings.   Electronically Signed   By: Esperanza Heir M.D.   On: 06/07/2014 13:01     EKG Interpretation None      MDM   Final diagnoses:  Knee pain, acute, right    X-ray shows arthritic changes in the knee. And signs of postoperative changes. We'll discharge the patient with Mobic. Followup with Dr. supple I personally performed the services described in this documentation, which was scribed in my presence. The recorded information has been reviewed and is accurate.   Arthor Captain, PA-C 06/07/14 1311

## 2014-06-07 NOTE — Discharge Instructions (Signed)
Cryotherapy °Cryotherapy means treatment with cold. Ice or gel packs can be used to reduce both pain and swelling. Ice is the most helpful within the first 24 to 48 hours after an injury or flare-up from overusing a muscle or joint. Sprains, strains, spasms, burning pain, shooting pain, and aches can all be eased with ice. Ice can also be used when recovering from surgery. Ice is effective, has very few side effects, and is safe for most people to use. °PRECAUTIONS  °Ice is not a safe treatment option for people with: °· Raynaud phenomenon. This is a condition affecting small blood vessels in the extremities. Exposure to cold may cause your problems to return. °· Cold hypersensitivity. There are many forms of cold hypersensitivity, including: °¨ Cold urticaria. Red, itchy hives appear on the skin when the tissues begin to warm after being iced. °¨ Cold erythema. This is a red, itchy rash caused by exposure to cold. °¨ Cold hemoglobinuria. Red blood cells break down when the tissues begin to warm after being iced. The hemoglobin that carry oxygen are passed into the urine because they cannot combine with blood proteins fast enough. °· Numbness or altered sensitivity in the area being iced. °If you have any of the following conditions, do not use ice until you have discussed cryotherapy with your caregiver: °· Heart conditions, such as arrhythmia, angina, or chronic heart disease. °· High blood pressure. °· Healing wounds or open skin in the area being iced. °· Current infections. °· Rheumatoid arthritis. °· Poor circulation. °· Diabetes. °Ice slows the blood flow in the region it is applied. This is beneficial when trying to stop inflamed tissues from spreading irritating chemicals to surrounding tissues. However, if you expose your skin to cold temperatures for too long or without the proper protection, you can damage your skin or nerves. Watch for signs of skin damage due to cold. °HOME CARE INSTRUCTIONS °Follow  these tips to use ice and cold packs safely. °· Place a dry or damp towel between the ice and skin. A damp towel will cool the skin more quickly, so you may need to shorten the time that the ice is used. °· For a more rapid response, add gentle compression to the ice. °· Ice for no more than 10 to 20 minutes at a time. The bonier the area you are icing, the less time it will take to get the benefits of ice. °· Check your skin after 5 minutes to make sure there are no signs of a poor response to cold or skin damage. °· Rest 20 minutes or more between uses. °· Once your skin is numb, you can end your treatment. You can test numbness by very lightly touching your skin. The touch should be so light that you do not see the skin dimple from the pressure of your fingertip. When using ice, most people will feel these normal sensations in this order: cold, burning, aching, and numbness. °· Do not use ice on someone who cannot communicate their responses to pain, such as small children or people with dementia. °HOW TO MAKE AN ICE PACK °Ice packs are the most common way to use ice therapy. Other methods include ice massage, ice baths, and cryosprays. Muscle creams that cause a cold, tingly feeling do not offer the same benefits that ice offers and should not be used as a substitute unless recommended by your caregiver. °To make an ice pack, do one of the following: °· Place crushed ice or a   bag of frozen vegetables in a sealable plastic bag. Squeeze out the excess air. Place this bag inside another plastic bag. Slide the bag into a pillowcase or place a damp towel between your skin and the bag.  Mix 3 parts water with 1 part rubbing alcohol. Freeze the mixture in a sealable plastic bag. When you remove the mixture from the freezer, it will be slushy. Squeeze out the excess air. Place this bag inside another plastic bag. Slide the bag into a pillowcase or place a damp towel between your skin and the bag. SEEK MEDICAL CARE  IF:  You develop white spots on your skin. This may give the skin a blotchy (mottled) appearance.  Your skin turns blue or pale.  Your skin becomes waxy or hard.  Your swelling gets worse. MAKE SURE YOU:   Understand these instructions.  Will watch your condition.  Will get help right away if you are not doing well or get worse. Document Released: 05/24/2011 Document Revised: 02/11/2014 Document Reviewed: 05/24/2011 [facility] Patient Information 2015 [place], [place]. This information is not intended to replace advice given to you by your health care provider. Make sure you discuss any questions you have with your health care provider.  Knee Pain The knee is the complex joint between your thigh and your lower leg. It is made up of bones, tendons, ligaments, and cartilage. The bones that make up the knee are:  The femur in the thigh.  The tibia and fibula in the lower leg.  The patella or kneecap riding in the groove on the lower femur. CAUSES  Knee pain is a common complaint with many causes. A few of these causes are:  Injury, such as:  A ruptured ligament or tendon injury.  Torn cartilage.  Medical conditions, such as:  Gout  Arthritis  Infections  Overuse, over training, or overdoing a physical activity. Knee pain can be minor or severe. Knee pain can accompany debilitating injury. Minor knee problems often respond well to self-care measures or get well on their own. More serious injuries may need medical intervention or even surgery. SYMPTOMS The knee is complex. Symptoms of knee problems can vary widely. Some of the problems are:  Pain with movement and weight bearing.  Swelling and tenderness.  Buckling of the knee.  Inability to straighten or extend your knee.  Your knee locks and you cannot straighten it.  Warmth and redness with pain and fever.  Deformity or dislocation of the kneecap. DIAGNOSIS  Determining what is wrong may be very straight  forward such as when there is an injury. It can also be challenging because of the complexity of the knee. Tests to make a diagnosis may include:  Your caregiver taking a history and doing a physical exam.  Routine X-rays can be used to rule out other problems. X-rays will not reveal a cartilage tear. Some injuries of the knee can be diagnosed by:  Arthroscopy a surgical technique by which a small video camera is inserted through tiny incisions on the sides of the knee. This procedure is used to examine and repair internal knee joint problems. Tiny instruments can be used during arthroscopy to repair the torn knee cartilage (meniscus).  Arthrography is a radiology technique. A contrast liquid is directly injected into the knee joint. Internal structures of the knee joint then become visible on X-ray film.  An MRI scan is a non X-ray radiology procedure in which magnetic fields and a computer produce two- or three-dimensional images  of the inside of the knee. Cartilage tears are often visible using an MRI scanner. MRI scans have largely replaced arthrography in diagnosing cartilage tears of the knee.  Blood work.  Examination of the fluid that helps to lubricate the knee joint (synovial fluid). This is done by taking a sample out using a needle and a syringe. TREATMENT The treatment of knee problems depends on the cause. Some of these treatments are:  Depending on the injury, proper casting, splinting, surgery, or physical therapy care will be needed.  Give yourself adequate recovery time. Do not overuse your joints. If you begin to get sore during workout routines, back off. Slow down or do fewer repetitions.  For repetitive activities such as cycling or running, maintain your strength and nutrition.  Alternate muscle groups. For example, if you are a weight lifter, work the upper body on one day and the lower body the next.  Either tight or weak muscles do not give the proper support for  your knee. Tight or weak muscles do not absorb the stress placed on the knee joint. Keep the muscles surrounding the knee strong.  Take care of mechanical problems.  If you have flat feet, orthotics or special shoes may help. See your caregiver if you need help.  Arch supports, sometimes with wedges on the inner or outer aspect of the heel, can help. These can shift pressure away from the side of the knee most bothered by osteoarthritis.  A brace called an "unloader" brace also may be used to help ease the pressure on the most arthritic side of the knee.  If your caregiver has prescribed crutches, braces, wraps or ice, use as directed. The acronym for this is PRICE. This means protection, rest, ice, compression, and elevation.  Nonsteroidal anti-inflammatory drugs (NSAIDs), can help relieve pain. But if taken immediately after an injury, they may actually increase swelling. Take NSAIDs with food in your stomach. Stop them if you develop stomach problems. Do not take these if you have a history of ulcers, stomach pain, or bleeding from the bowel. Do not take without your caregiver's approval if you have problems with fluid retention, heart failure, or kidney problems.  For ongoing knee problems, physical therapy may be helpful.  Glucosamine and chondroitin are over-the-counter dietary supplements. Both may help relieve the pain of osteoarthritis in the knee. These medicines are different from the usual anti-inflammatory drugs. Glucosamine may decrease the rate of cartilage destruction.  Injections of a corticosteroid drug into your knee joint may help reduce the symptoms of an arthritis flare-up. They may provide pain relief that lasts a few months. You may have to wait a few months between injections. The injections do have a small increased risk of infection, water retention, and elevated blood sugar levels.  Hyaluronic acid injected into damaged joints may ease pain and provide lubrication.  These injections may work by reducing inflammation. A series of shots may give relief for as long as 6 months.  Topical painkillers. Applying certain ointments to your skin may help relieve the pain and stiffness of osteoarthritis. Ask your pharmacist for suggestions. Many over the-counter products are approved for temporary relief of arthritis pain.  In some countries, doctors often prescribe topical NSAIDs for relief of chronic conditions such as arthritis and tendinitis. A review of treatment with NSAID creams found that they worked as well as oral medications but without the serious side effects. PREVENTION  Maintain a healthy weight. Extra pounds put more strain on your  joints.  Get strong, stay limber. Weak muscles are a common cause of knee injuries. Stretching is important. Include flexibility exercises in your workouts.  Be smart about exercise. If you have osteoarthritis, chronic knee pain or recurring injuries, you may need to change the way you exercise. This does not mean you have to stop being active. If your knees ache after jogging or playing basketball, consider switching to swimming, water aerobics, or other low-impact activities, at least for a few days a week. Sometimes limiting high-impact activities will provide relief.  Make sure your shoes fit well. Choose footwear that is right for your sport.  Protect your knees. Use the proper gear for knee-sensitive activities. Use kneepads when playing volleyball or laying carpet. Buckle your seat belt every time you drive. Most shattered kneecaps occur in car accidents.  Rest when you are tired. SEEK MEDICAL CARE IF:  You have knee pain that is continual and does not seem to be getting better.  SEEK IMMEDIATE MEDICAL CARE IF:  Your knee joint feels hot to the touch and you have a high fever. MAKE SURE YOU:   Understand these instructions.  Will watch your condition.  Will get help right away if you are not doing well or get  worse. Document Released: 07/25/2007 Document Revised: 12/20/2011 Document Reviewed: 07/25/2007 [facility] Patient Information 2015 [place], [place]. This information is not intended to replace advice given to you by your health care provider. Make sure you discuss any questions you have with your health care provider. Osteoarthritis Osteoarthritis is a disease that causes soreness and inflammation of a joint. It occurs when the cartilage at the affected joint wears down. Cartilage acts as a cushion, covering the ends of bones where they meet to form a joint. Osteoarthritis is the most common form of arthritis. It often occurs in older people. The joints affected most often by this condition include those in the:  Ends of the fingers.  Thumbs.  Neck.  Lower back.  Knees.  Hips. CAUSES  Over time, the cartilage that covers the ends of bones begins to wear away. This causes bone to rub on bone, producing pain and stiffness in the affected joints.  RISK FACTORS Certain factors can increase your chances of having osteoarthritis, including:  Older age.  Excessive body weight.  Overuse of joints.  Previous joint injury. SIGNS AND SYMPTOMS   Pain, swelling, and stiffness in the joint.  Over time, the joint may lose its normal shape.  Small deposits of bone (osteophytes) may grow on the edges of the joint.  Bits of bone or cartilage can break off and float inside the joint space. This may cause more pain and damage. DIAGNOSIS  Your health care provider will do a physical exam and ask about your symptoms. Various tests may be ordered, such as:  X-rays of the affected joint.  An MRI scan.  Blood tests to rule out other types of arthritis.  Joint fluid tests. This involves using a needle to draw fluid from the joint and examining the fluid under a microscope. TREATMENT  Goals of treatment are to control pain and improve joint function. Treatment plans may include:  A prescribed  exercise program that allows for rest and joint relief.  A weight control plan.  Pain relief techniques, such as:  Properly applied heat and cold.  Electric pulses delivered to nerve endings under the skin (transcutaneous electrical nerve stimulation [TENS]).  Massage.  Certain nutritional supplements.  Medicines to control pain, such  as:  Acetaminophen.  Nonsteroidal anti-inflammatory drugs (NSAIDs), such as naproxen.  Narcotic or central-acting agents, such as tramadol.  Corticosteroids. These can be given orally or as an injection.  Surgery to reposition the bones and relieve pain (osteotomy) or to remove loose pieces of bone and cartilage. Joint replacement may be needed in advanced states of osteoarthritis. HOME CARE INSTRUCTIONS   Take medicines only as directed by your health care provider.  Maintain a healthy weight. Follow your health care provider's instructions for weight control. This may include dietary instructions.  Exercise as directed. Your health care provider can recommend specific types of exercise. These may include:  Strengthening exercises. These are done to strengthen the muscles that support joints affected by arthritis. They can be performed with weights or with exercise bands to add resistance.  Aerobic activities. These are exercises, such as brisk walking or low-impact aerobics, that get your heart pumping.  Range-of-motion activities. These keep your joints limber.  Balance and agility exercises. These help you maintain daily living skills.  Rest your affected joints as directed by your health care provider.  Keep all follow-up visits as directed by your health care provider. SEEK MEDICAL CARE IF:   Your skin turns red.  You develop a rash in addition to your joint pain.  You have worsening joint pain.  You have a fever along with joint or muscle aches. SEEK IMMEDIATE MEDICAL CARE IF:  You have a significant loss of weight or  appetite.  You have night sweats. FOR MORE INFORMATION   National Institute of Arthritis and Musculoskeletal and Skin Diseases: www.niams.http://www.myers.net/  [employer] on Aging: https://walker.com/  American College of Rheumatology: www.rheumatology.org Document Released: 09/27/2005 Document Revised: 02/11/2014 Document Reviewed: 06/04/2013 [facility] Patient Information 2015 [place], [place]. This information is not intended to replace advice given to you by your health care provider. Make sure you discuss any questions you have with your health care provider.

## 2014-06-20 ENCOUNTER — Telehealth: Payer: Self-pay

## 2014-06-20 ENCOUNTER — Ambulatory Visit: Payer: Self-pay | Admitting: Obstetrics & Gynecology

## 2014-06-20 NOTE — Telephone Encounter (Signed)
Patient missed appointment today with Dr. Erin Fulling for implant removal. Called patient. No answer. Left message stating we are sorry you missed your appointment this afternoon with [place], please call clinic if you would like to reschedule.

## 2014-08-11 ENCOUNTER — Emergency Department (HOSPITAL_COMMUNITY)
Admission: EM | Admit: 2014-08-11 | Discharge: 2014-08-11 | Discharge status: Against Medical Advice | Payer: Self-pay | Attending: Emergency Medicine | Admitting: Emergency Medicine

## 2014-08-11 ENCOUNTER — Encounter (HOSPITAL_COMMUNITY): Payer: Self-pay | Admitting: Emergency Medicine

## 2014-08-11 ENCOUNTER — Other Ambulatory Visit (HOSPITAL_COMMUNITY)
Admission: RE | Admit: 2014-08-11 | Discharge: 2014-08-11 | Disposition: A | Discharge status: Home / Self Care | Payer: Self-pay | Source: Ambulatory Visit | Attending: Emergency Medicine | Admitting: Emergency Medicine

## 2014-08-11 ENCOUNTER — Emergency Department (INDEPENDENT_AMBULATORY_CARE_PROVIDER_SITE_OTHER)
Admission: EM | Admit: 2014-08-11 | Discharge: 2014-08-11 | Disposition: A | Discharge status: Home / Self Care | Payer: Self-pay | Source: Home / Self Care | Attending: Emergency Medicine | Admitting: Emergency Medicine

## 2014-08-11 DIAGNOSIS — M7041 Prepatellar bursitis, right knee: Secondary | ICD-10-CM

## 2014-08-11 DIAGNOSIS — J45909 Unspecified asthma, uncomplicated: Secondary | ICD-10-CM | POA: Insufficient documentation

## 2014-08-11 DIAGNOSIS — N760 Acute vaginitis: Secondary | ICD-10-CM

## 2014-08-11 DIAGNOSIS — G8929 Other chronic pain: Secondary | ICD-10-CM | POA: Insufficient documentation

## 2014-08-11 DIAGNOSIS — B9689 Other specified bacterial agents as the cause of diseases classified elsewhere: Secondary | ICD-10-CM

## 2014-08-11 DIAGNOSIS — Z113 Encounter for screening for infections with a predominantly sexual mode of transmission: Secondary | ICD-10-CM | POA: Insufficient documentation

## 2014-08-11 DIAGNOSIS — A499 Bacterial infection, unspecified: Secondary | ICD-10-CM

## 2014-08-11 DIAGNOSIS — N898 Other specified noninflammatory disorders of vagina: Secondary | ICD-10-CM | POA: Insufficient documentation

## 2014-08-11 DIAGNOSIS — M25561 Pain in right knee: Secondary | ICD-10-CM | POA: Insufficient documentation

## 2014-08-11 DIAGNOSIS — M25569 Pain in unspecified knee: Secondary | ICD-10-CM

## 2014-08-11 LAB — URINALYSIS, ROUTINE W REFLEX MICROSCOPIC
Bilirubin Urine: NEGATIVE
Glucose, UA: NEGATIVE mg/dL
Ketones, ur: 15 mg/dL — AB
Nitrite: NEGATIVE
Protein, ur: NEGATIVE mg/dL
Specific Gravity, Urine: 1.034 — ABNORMAL HIGH (ref 1.005–1.030)
Urobilinogen, UA: 0.2 mg/dL (ref 0.0–1.0)
pH: 5 (ref 5.0–8.0)

## 2014-08-11 LAB — URINE MICROSCOPIC-ADD ON

## 2014-08-11 LAB — POC URINE PREG, ED: Preg Test, Ur: NEGATIVE

## 2014-08-11 MED ORDER — MELOXICAM 15 MG PO TABS: 15.0000 mg | ORAL_TABLET | Freq: Every day | ORAL | Status: DC

## 2014-08-11 MED ORDER — METRONIDAZOLE 500 MG PO TABS: 500.0000 mg | ORAL_TABLET | Freq: Two times a day (BID) | ORAL | Status: DC

## 2014-08-11 NOTE — ED Provider Notes (Signed)
CSN: [id number]     Arrival date & time 08/11/14  1413 History   First MD Initiated Contact with Patient 08/11/14 1436     Chief Complaint  Patient presents with  . Knee Pain   (Consider location/radiation/quality/duration/timing/severity/associated sxs/prior Treatment) HPI  She is a 28 year old woman here for evaluation of vaginal discharge and right knee pain.  The right knee pain is associated with some swelling. 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 This was secondary to an injury. She states recently she has been walking 2 miles to work into [place] home. The knee is swelling. The pain is worse with full extension and weightbearing. She has taken some intermittent ibuprofen.  The vaginal discharge started 2-3 days ago. She denies any vaginal itching or foul odor. Her last menstrual period was October 21. She reports some mild pelvic discomfort. No dysuria. No new sexual contacts. Last sex was 2-3 months ago.  Past Medical History  Diagnosis Date  . Asthma   . Dysrhythmia, cardiac   . Kidney stone   . Chlamydia     Pt reports treated 12/2012; no further contact with that partner   Past Surgical History  Procedure Laterality Date  . Knee surgery     Family History  Problem Relation Age of Onset  . Stroke Mother   . Gout Father    History  Substance Use Topics  . Smoking status: Never Smoker   . Smokeless tobacco: Never Used  . Alcohol Use: No   OB History    Gravida Para Term Preterm AB TAB SAB Ectopic Multiple Living   2 2 2       2      Review of Systems  Constitutional: Negative.   Genitourinary: Positive for vaginal discharge and pelvic pain (mild discomfrot). Negative for dysuria, frequency, flank pain and vaginal pain.  Musculoskeletal:       Right knee pain and swelling    Allergies  Review of patient's allergies indicates no known allergies.  Home Medications   Prior to Admission medications   Medication Sig  Start Date End Date Taking? Authorizing Provider  Etonogestrel (IMPLANON ) Inject 1 each into the skin once. Placed in 2011.    Historical Provider, MD  ibuprofen (ADVIL,MOTRIN) 800 MG tablet Take 800 mg by mouth every 8 (eight) hours as needed for headache.    Historical Provider, MD  meloxicam (MOBIC) 15 MG tablet Take 1 tablet (15 mg total) by mouth daily. For 1 week, then as needed 08/11/14   Charm RingsErin J Lynton Crescenzo, MD  metroNIDAZOLE (FLAGYL) 500 MG tablet Take 1 tablet (500 mg total) by mouth 2 (two) times daily. 08/11/14   Charm RingsErin J Ottis Vacha, MD  Multiple Vitamin (MULTIVITAMIN WITH MINERALS) TABS tablet Take 1 tablet by mouth daily.    Historical Provider, MD   BP 110/72 mmHg  Pulse 73  Temp(Src) 98.2 F (36.8 C) (Oral)  Resp 18  SpO2 97% Physical Exam  Constitutional: She is oriented to person, place, and time. She appears well-developed and well-nourished. No distress.  Genitourinary: There is no rash or tenderness on the right labia. There is no rash or tenderness on the left labia. Vaginal discharge (thin greenish white) found.  Musculoskeletal:  Right knee: well healed scar.  Mild swelling at distal patellar.  Tender at inferior medial knee.  No joint laxity.  Full active ROM.  Neurological: She is alert and oriented to person, place, and time.  ED Course  Procedures (including critical care time) Labs Review Labs Reviewed  CERVICOVAGINAL ANCILLARY ONLY    Imaging Review No results found.   MDM   1. BV (bacterial vaginosis)   2. Prepatellar bursitis, right    We'll treat BV with Flagyl 500 mg twice a day 7 days. Swab sent for GC, chlamydia, trichomonas, wet prep.  Meloxicam and icing for the knee pain. Follow-up as needed.    Charm RingsErin J Olson Lucarelli, MD 08/11/14 [phone]

## 2014-08-11 NOTE — Discharge Instructions (Signed)
You have some irritation in your knee. Take meloxicam 1 pill daily for 1 week, then as needed.  Do not take with ibuprofen, aleve, or advil. Ice the knee 2-3 times a day.  You have BV. Take Flagyl 1 pill twice a day for 1 week.  If any of your testing comes back positive, we will call you.

## 2014-08-11 NOTE — ED Notes (Signed)
28 year old female with c/o swollen right knee also c/0 vaginal discharge for past several days.

## 2014-08-11 NOTE — ED Notes (Signed)
Pt c/o right knee pain that is chronic in nature and worse since pt has been walking to work; pt sts white vaginal discharge with LMP last week

## 2014-08-12 ENCOUNTER — Encounter (HOSPITAL_COMMUNITY): Payer: Self-pay | Admitting: Emergency Medicine

## 2014-08-12 LAB — CERVICOVAGINAL ANCILLARY ONLY
Chlamydia: NEGATIVE
Neisseria Gonorrhea: NEGATIVE

## 2014-08-13 LAB — CERVICOVAGINAL ANCILLARY ONLY
Wet Prep (BD Affirm): NEGATIVE
Wet Prep (BD Affirm): POSITIVE — AB
Wet Prep (BD Affirm): POSITIVE — AB

## 2014-08-14 ENCOUNTER — Telehealth (HOSPITAL_COMMUNITY): Payer: Self-pay | Admitting: *Deleted

## 2014-08-14 NOTE — ED Notes (Addendum)
GC/chlamydia neg., Affirm: Candida neg. Gardnerella and Trich pos.  Pt. adequately treated with Flagyl.  I called pt. and left a message to call. Call 1. Linda Chen, Linda Chen M 08/14/2014 Pt. Called back and left a message. I called pt. Back.  Pt. verified x 2 and given results.  Pt. Told she was adequately treated for both infections with Flagyl. Pt. Instructed to notify her partner to be treated with Flagyl, no sex until she has finished her medication and her partner has been treated and to practice safe sex. Pt.instructed to  [facility], by appointment.

## 2014-10-08 ENCOUNTER — Emergency Department (INDEPENDENT_AMBULATORY_CARE_PROVIDER_SITE_OTHER)
Admission: EM | Admit: 2014-10-08 | Discharge: 2014-10-08 | Disposition: A | Discharge status: Nursing Home (Includes ICF) | Payer: Self-pay | Source: Home / Self Care | Attending: Family Medicine | Admitting: Family Medicine

## 2014-10-08 ENCOUNTER — Encounter (HOSPITAL_COMMUNITY): Payer: Self-pay | Admitting: Emergency Medicine

## 2014-10-08 ENCOUNTER — Emergency Department (HOSPITAL_COMMUNITY): Payer: Self-pay

## 2014-10-08 ENCOUNTER — Emergency Department (HOSPITAL_COMMUNITY)
Admission: EM | Admit: 2014-10-08 | Discharge: 2014-10-08 | Disposition: A | Discharge status: Home / Self Care | Payer: Self-pay | Attending: Emergency Medicine | Admitting: Emergency Medicine

## 2014-10-08 DIAGNOSIS — Z87442 Personal history of urinary calculi: Secondary | ICD-10-CM | POA: Insufficient documentation

## 2014-10-08 DIAGNOSIS — R109 Unspecified abdominal pain: Secondary | ICD-10-CM

## 2014-10-08 DIAGNOSIS — N8329 Other ovarian cysts: Secondary | ICD-10-CM | POA: Insufficient documentation

## 2014-10-08 DIAGNOSIS — R3989 Other symptoms and signs involving the genitourinary system: Secondary | ICD-10-CM

## 2014-10-08 DIAGNOSIS — R1011 Right upper quadrant pain: Secondary | ICD-10-CM

## 2014-10-08 DIAGNOSIS — Z8619 Personal history of other infectious and parasitic diseases: Secondary | ICD-10-CM | POA: Insufficient documentation

## 2014-10-08 DIAGNOSIS — R3915 Urgency of urination: Secondary | ICD-10-CM

## 2014-10-08 DIAGNOSIS — Z79899 Other long term (current) drug therapy: Secondary | ICD-10-CM | POA: Insufficient documentation

## 2014-10-08 DIAGNOSIS — J45909 Unspecified asthma, uncomplicated: Secondary | ICD-10-CM | POA: Insufficient documentation

## 2014-10-08 DIAGNOSIS — N83201 Unspecified ovarian cyst, right side: Secondary | ICD-10-CM

## 2014-10-08 DIAGNOSIS — K8020 Calculus of gallbladder without cholecystitis without obstruction: Secondary | ICD-10-CM | POA: Insufficient documentation

## 2014-10-08 DIAGNOSIS — Z792 Long term (current) use of antibiotics: Secondary | ICD-10-CM | POA: Insufficient documentation

## 2014-10-08 DIAGNOSIS — M546 Pain in thoracic spine: Secondary | ICD-10-CM

## 2014-10-08 LAB — CBC WITH DIFFERENTIAL/PLATELET
Basophils Absolute: 0 10*3/uL (ref 0.0–0.1)
Basophils Relative: 1 % (ref 0–1)
Eosinophils Absolute: 0.4 10*3/uL (ref 0.0–0.7)
Eosinophils Relative: 8 % — ABNORMAL HIGH (ref 0–5)
HCT: 39.3 % (ref 36.0–46.0)
Hemoglobin: 12.5 g/dL (ref 12.0–15.0)
Lymphocytes Relative: 36 % (ref 12–46)
Lymphs Abs: 1.9 10*3/uL (ref 0.7–4.0)
MCH: 28.4 pg (ref 26.0–34.0)
MCHC: 31.8 g/dL (ref 30.0–36.0)
MCV: 89.3 fL (ref 78.0–100.0)
Monocytes Absolute: 0.3 10*3/uL (ref 0.1–1.0)
Monocytes Relative: 5 % (ref 3–12)
Neutro Abs: 2.6 10*3/uL (ref 1.7–7.7)
Neutrophils Relative %: 50 % (ref 43–77)
Platelets: 261 10*3/uL (ref 150–400)
RBC: 4.4 MIL/uL (ref 3.87–5.11)
RDW: 13 % (ref 11.5–15.5)
WBC: 5.3 10*3/uL (ref 4.0–10.5)

## 2014-10-08 LAB — POCT PREGNANCY, URINE: Preg Test, Ur: NEGATIVE

## 2014-10-08 LAB — BASIC METABOLIC PANEL
Anion gap: 8 (ref 5–15)
BUN: 11 mg/dL (ref 6–23)
CO2: 26 mmol/L (ref 19–32)
Calcium: 8.8 mg/dL (ref 8.4–10.5)
Chloride: 106 mEq/L (ref 96–112)
Creatinine, Ser: 1.09 mg/dL (ref 0.50–1.10)
GFR calc Af Amer: 79 mL/min — ABNORMAL LOW (ref >90)
GFR calc non Af Amer: 68 mL/min — ABNORMAL LOW (ref >90)
Glucose, Bld: 108 mg/dL — ABNORMAL HIGH (ref 70–99)
Potassium: 4 mmol/L (ref 3.5–5.1)
Sodium: 140 mmol/L (ref 135–145)

## 2014-10-08 LAB — POCT URINALYSIS DIP (DEVICE)
Bilirubin Urine: NEGATIVE
Glucose, UA: NEGATIVE mg/dL
Ketones, ur: NEGATIVE mg/dL
Nitrite: NEGATIVE
Protein, ur: NEGATIVE mg/dL
Specific Gravity, Urine: 1.02 (ref 1.005–1.030)
Urobilinogen, UA: 0.2 mg/dL (ref 0.0–1.0)
pH: 5.5 (ref 5.0–8.0)

## 2014-10-08 MED ORDER — IOHEXOL 300 MG/ML  SOLN
100.0000 mL | Freq: Once | INTRAMUSCULAR | Status: AC | PRN
  Administered 2014-10-08: 100 mL via INTRAVENOUS

## 2014-10-08 MED ORDER — IOHEXOL 300 MG/ML  SOLN
25.0000 mL | Freq: Once | INTRAMUSCULAR | Status: AC | PRN
  Administered 2014-10-08: 25 mL via ORAL

## 2014-10-08 MED ORDER — FENTANYL CITRATE 0.05 MG/ML IJ SOLN
50.0000 ug | Freq: Once | INTRAMUSCULAR | Status: AC
  Administered 2014-10-08: 50 ug via INTRAVENOUS
  Filled 2014-10-08: qty 2

## 2014-10-08 MED ORDER — ONDANSETRON HCL 4 MG/2ML IJ SOLN
4.0000 mg | Freq: Once | INTRAMUSCULAR | Status: AC
  Administered 2014-10-08: 4 mg via INTRAVENOUS
  Filled 2014-10-08: qty 2

## 2014-10-08 MED ORDER — ONDANSETRON 4 MG PO TBDP: ORAL_TABLET | ORAL | Status: DC

## 2014-10-08 MED ORDER — OXYCODONE-ACETAMINOPHEN 5-325 MG PO TABS: 1.0000 | ORAL_TABLET | Freq: Four times a day (QID) | ORAL | Status: DC | PRN

## 2014-10-08 MED ORDER — OXYCODONE-ACETAMINOPHEN 5-325 MG PO TABS
1.0000 | ORAL_TABLET | Freq: Once | ORAL | Status: AC
  Administered 2014-10-08: 1 via ORAL
  Filled 2014-10-08: qty 1

## 2014-10-08 NOTE — ED Notes (Signed)
Patient called out reporting nausea, Onalee Huaavid NP aware.

## 2014-10-08 NOTE — ED Provider Notes (Signed)
CSN: [id number]     Arrival date & time 10/08/14  1012 History   First MD Initiated Contact with Patient 10/08/14 1115     Chief Complaint  Patient presents with  . Abdominal Pain   (Consider location/radiation/quality/duration/timing/severity/associated sxs/prior Treatment) HPI Comments: 28 year old obese female complaining of pain in the right hemiabdomen radiating to the right flank for one week. It is intermittent but has increased in intensity over the past 24 hours. She states it begins at [employer] and radiates to the right lateral abdomen. She is currently having pain. She has had some intermittent nausea but no vomiting. She had one episode of diarrhea today. Last night she had a normal bowel movement and has had regular bowel movements in recent days. Denies fever, chills or upper respiratory symptoms. Denies bleeding. She also complains of a fullness of the bladder with urinary urgency. Denies dysuria or frequency. This started approximately one week ago.   Past Medical History  Diagnosis Date  . Asthma   . Dysrhythmia, cardiac   . Kidney stone   . Chlamydia     Pt reports treated 12/2012; no further contact with that partner   Past Surgical History  Procedure Laterality Date  . Knee surgery     Family History  Problem Relation Age of Onset  . Stroke Mother   . Gout Father    History  Substance Use Topics  . Smoking status: Never Smoker   . Smokeless tobacco: Never Used  . Alcohol Use: No   OB History    Gravida Para Term Preterm AB TAB SAB Ectopic Multiple Living   2 2 2       2      Review of Systems  Constitutional: Positive for activity change. Negative for fever, appetite change and fatigue.  HENT: Negative.   Respiratory: Negative for cough and shortness of breath.   Cardiovascular: Negative for chest pain.  Gastrointestinal: Positive for nausea and abdominal pain. Negative for vomiting, constipation and blood in stool.  Genitourinary: Positive for  urgency and flank pain. Negative for dysuria, frequency, vaginal bleeding and vaginal discharge.       Suprapubic pain  Musculoskeletal: Positive for back pain.  Skin: Negative.   Neurological: Negative.     Allergies  Review of patient's allergies indicates no known allergies.  Home Medications   Prior to Admission medications   Medication Sig Start Date End Date Taking? Authorizing Provider  Etonogestrel (IMPLANON [place]) Inject 1 each into the skin once. Placed in 2011.    Historical Provider, MD  ibuprofen (ADVIL,MOTRIN) 800 MG tablet Take 800 mg by mouth every 8 (eight) hours as needed for headache.    Historical Provider, MD  meloxicam (MOBIC) 15 MG tablet Take 1 tablet (15 mg total) by mouth daily. For 1 week, then as needed 08/11/14   Charm RingsErin J Honig, MD  metroNIDAZOLE (FLAGYL) 500 MG tablet Take 1 tablet (500 mg total) by mouth 2 (two) times daily. 08/11/14   Charm RingsErin J Honig, MD  Multiple Vitamin (MULTIVITAMIN WITH MINERALS) TABS tablet Take 1 tablet by mouth daily.    Historical Provider, MD   BP 123/75 mmHg  Pulse 74  Temp(Src) 97.9 F (36.6 C) (Oral)  Resp 16  SpO2 97%  LMP 09/28/2014 Physical Exam  Constitutional: She is oriented to person, place, and time. She appears well-developed and well-nourished. No distress.  Neck: Normal range of motion. Neck supple.  Cardiovascular: Normal rate, regular rhythm, normal heart sounds and intact distal pulses.  Pulmonary/Chest: Effort normal and breath sounds normal. No respiratory distress. She has no wheezes.  Abdominal: Soft. She exhibits no distension. There is tenderness. There is no rebound and no guarding.  There is tenderness in the right mid and upper abdomen from the midline to the posterior axillary line. Negative Murphy's. There is tenderness over the suprapubic area. No lower quadrant tenderness.  Musculoskeletal:  Tenderness to a small area over the right far lateral parathoracic musculature in a small area.  Neurological:  She is alert and oriented to person, place, and time.  Skin: Skin is warm and dry.  Psychiatric: She has a normal mood and affect.  Nursing note and vitals reviewed.   ED Course  Procedures (including critical care time) Labs Review Labs Reviewed  POCT URINALYSIS DIP (DEVICE) - Abnormal; Notable for the following:    Hgb urine dipstick TRACE (*)    Leukocytes, UA SMALL (*)    All other components within normal limits  POCT PREGNANCY, URINE   Results for orders placed or performed during the hospital encounter of 10/08/14  POCT urinalysis dip (device)  Result Value Ref Range   Glucose, UA NEGATIVE NEGATIVE mg/dL   Bilirubin Urine NEGATIVE NEGATIVE   Ketones, ur NEGATIVE NEGATIVE mg/dL   Specific Gravity, Urine 1.020 1.005 - 1.030   Hgb urine dipstick TRACE (A) NEGATIVE   pH 5.5 5.0 - 8.0   Protein, ur NEGATIVE NEGATIVE mg/dL   Urobilinogen, UA 0.2 0.0 - 1.0 mg/dL   Nitrite NEGATIVE NEGATIVE   Leukocytes, UA SMALL (A) NEGATIVE  Pregnancy, urine POC  Result Value Ref Range   Preg Test, Ur NEGATIVE NEGATIVE    Imaging Review No results found.   MDM   1. Right upper quadrant pain   2. Right-sided thoracic back pain   3. Urinary urgency   4. Bladder pain    Transfer to the ED for evaluation of right hemiabdominal pain and tenderness worsening over past 48h. Most likely has a UTI and a culture has been ordered. Her R back pain likely due to muscle strain. Hx of ureteral stone by pt but the CT urogram did not support that finding.      Hayden Rasmussenavid Porscha Axley, NP 10/08/14 1154

## 2014-10-08 NOTE — ED Notes (Signed)
[facility] and sent here for further eval; pt sts frequency

## 2014-10-08 NOTE — ED Notes (Signed)
Patient c/o abdominal pain from her navel to her right side onset yesterday. Patient reports pain feels like she has kidney stones. Patient reports she felt also like it was a UTI and started drinking cranberry juice. Feels the urge to urinate. Patient is in NAD.

## 2014-10-08 NOTE — ED Notes (Signed)
Pt a/o x 4 on d/c with steady gait. Pt declined wheelchair. [facility]

## 2014-10-08 NOTE — ED Provider Notes (Signed)
CSN: [id number]     Arrival date & time 10/08/14  1233 History   First MD Initiated Contact with Patient 10/08/14 1555     Chief Complaint  Patient presents with  . Dysuria     (Consider location/radiation/quality/duration/timing/severity/associated sxs/prior Treatment) Patient is a 28 y.o. female presenting with abdominal pain. The history is provided by the patient and medical records. No language interpreter was used.  Abdominal Pain Pain location:  R flank and periumbilical Pain severity:  Moderate Onset quality:  Sudden Duration:  1 week Timing:  Intermittent Progression:  Worsening Chronicity:  New Associated symptoms: no vaginal bleeding   Risk factors: obesity     Past Medical History  Diagnosis Date  . Asthma   . Dysrhythmia, cardiac   . Kidney stone   . Chlamydia     Pt reports treated 12/2012; no further contact with that partner   Past Surgical History  Procedure Laterality Date  . Knee surgery     Family History  Problem Relation Age of Onset  . Stroke Mother   . Gout Father    History  Substance Use Topics  . Smoking status: Never Smoker   . Smokeless tobacco: Never Used  . Alcohol Use: No   OB History    Gravida Para Term Preterm AB TAB SAB Ectopic Multiple Living   2 2 2       2      Review of Systems  Gastrointestinal: Positive for abdominal pain.  Genitourinary: Negative for vaginal bleeding.  All other systems reviewed and are negative.     Allergies  Review of patient's allergies indicates no known allergies.  Home Medications   Prior to Admission medications   Medication Sig Start Date End Date Taking? Authorizing Provider  Etonogestrel (IMPLANON [place]) Inject 1 each into the skin once. Placed in 2011.    Historical Provider, MD  ibuprofen (ADVIL,MOTRIN) 800 MG tablet Take 800 mg by mouth every 8 (eight) hours as needed for headache.    Historical Provider, MD  meloxicam (MOBIC) 15 MG tablet Take 1 tablet (15 mg total) by mouth  daily. For 1 week, then as needed 08/11/14   Charm RingsErin J Honig, MD  metroNIDAZOLE (FLAGYL) 500 MG tablet Take 1 tablet (500 mg total) by mouth 2 (two) times daily. 08/11/14   Charm RingsErin J Honig, MD  Multiple Vitamin (MULTIVITAMIN WITH MINERALS) TABS tablet Take 1 tablet by mouth daily.    Historical Provider, MD   BP 109/60 mmHg  Pulse 62  Temp(Src) 97.9 F (36.6 C) (Oral)  Resp 20  SpO2 100%  LMP 09/28/2014 Physical Exam  Constitutional: She is oriented to person, place, and time. She appears well-developed and well-nourished.  HENT:  Head: Normocephalic.  Eyes: Pupils are equal, round, and reactive to light.  Neck: Neck supple.  Cardiovascular: Normal rate and regular rhythm.   Pulmonary/Chest: Effort normal and breath sounds normal.  Abdominal: Soft. There is tenderness.  Musculoskeletal: She exhibits no edema or tenderness.  Lymphadenopathy:    She has no cervical adenopathy.  Neurological: She is alert and oriented to person, place, and time.  Skin: Skin is warm and dry.  Psychiatric: She has a normal mood and affect.  Nursing note and vitals reviewed.   ED Course  Procedures (including critical care time) Labs Review Labs Reviewed  CBC WITH DIFFERENTIAL  BASIC METABOLIC PANEL    Imaging Review No results found.   EKG Interpretation None     Patient sent from urgent care for  evaluation of right sided abdominal pain.  Pain is primarily above the umbilicus and radiates from midline to right upper quadrant.  Patient reports feeling of fullness in bladder,  and has mild tenderness in the suprapubic area to palpation, but otherwise has no lower quadrant pain. Denies vaginal discharge. Urine with trace blood and leukocytes, and culture was ordered at urgent care. Radiology results reviewed and shared with patient.  Non-obstructing gall stone noted.  Large ovarian cyst on right.  Discussed with Dr. Juleen ChinaKohut.  Will discharge home with analgesic and anti-emetic.  Follow-up with general  surgery and with women's outpatient clinic.  Return precautions discussed. MDM   Final diagnoses:  Abdominal pain        Jimmye Normanavid John Trinika Cortese, NP 10/09/14 [id number]  Raeford RazorStephen Kohut, MD 10/10/14 1213

## 2014-10-08 NOTE — ED Notes (Signed)
Called CT to report pt finished contrast.

## 2014-10-08 NOTE — Discharge Instructions (Signed)
Ovarian Cyst An ovarian cyst is a fluid-filled sac that forms on an ovary. The ovaries are small organs that produce eggs in women. Various types of cysts can form on the ovaries. Most are not cancerous. Many do not cause problems, and they often go away on their own. Some may cause symptoms and require treatment. Common types of ovarian cysts include:  Functional cysts--These cysts may occur every month during the menstrual cycle. This is normal. The cysts usually go away with the next menstrual cycle if the woman does not get pregnant. Usually, there are no symptoms with a functional cyst.  Endometrioma cysts--These cysts form from the tissue that lines the uterus. They are also called "chocolate cysts" because they become filled with blood that turns [place]. This type of cyst can cause pain in the lower abdomen during intercourse and with your menstrual period.  Cystadenoma cysts--This type develops from the cells on the outside of the ovary. These cysts can get very big and cause lower abdomen pain and pain with intercourse. This type of cyst can twist on itself, cut off its blood supply, and cause severe pain. It can also easily rupture and cause a lot of pain.  Dermoid cysts--This type of cyst is sometimes found in both ovaries. These cysts may contain different kinds of body tissue, such as skin, teeth, hair, or cartilage. They usually do not cause symptoms unless they get very big.  Theca lutein cysts--These cysts occur when too much of a certain hormone (human chorionic gonadotropin) is produced and overstimulates the ovaries to produce an egg. This is most common after procedures used to assist with the conception of a baby (in vitro fertilization). CAUSES   Fertility drugs can cause a condition in which multiple large cysts are formed on the ovaries. This is called ovarian hyperstimulation syndrome.  A condition called polycystic ovary syndrome can cause hormonal imbalances that can lead to  nonfunctional ovarian cysts. SIGNS AND SYMPTOMS  Many ovarian cysts do not cause symptoms. If symptoms are present, they may include:  Pelvic pain or pressure.  Pain in the lower abdomen.  Pain during sexual intercourse.  Increasing girth (swelling) of the abdomen.  Abnormal menstrual periods.  Increasing pain with menstrual periods.  Stopping having menstrual periods without being pregnant. DIAGNOSIS  These cysts are commonly found during a routine or annual pelvic exam. Tests may be ordered to find out more about the cyst. These tests may include:  Ultrasound.  X-ray of the pelvis.  CT scan.  MRI.  Blood tests. TREATMENT  Many ovarian cysts go away on their own without treatment. Your health care provider may want to check your cyst regularly for 2-3 months to see if it changes. For women in menopause, it is particularly important to monitor a cyst closely because of the higher rate of ovarian cancer in menopausal women. When treatment is needed, it may include any of the following:  A procedure to drain the cyst (aspiration). This may be done using a long needle and ultrasound. It can also be done through a laparoscopic procedure. This involves using a thin, lighted tube with a tiny camera on the end (laparoscope) inserted through a small incision.  Surgery to remove the whole cyst. This may be done using laparoscopic surgery or an open surgery involving a larger incision in the lower abdomen.  Hormone treatment or birth control pills. These methods are sometimes used to help dissolve a cyst. HOME CARE INSTRUCTIONS   Only take over-the-counter  or prescription medicines as directed by your health care provider.  Follow up with your health care provider as directed.  Get regular pelvic exams and Pap tests. SEEK MEDICAL CARE IF:   Your periods are late, irregular, or painful, or they stop.  Your pelvic pain or abdominal pain does not go away.  Your abdomen becomes  larger or swollen.  You have pressure on your bladder or trouble emptying your bladder completely.  You have pain during sexual intercourse.  You have feelings of fullness, pressure, or discomfort in your stomach.  You lose weight for no apparent reason.  You feel generally ill.  You become constipated.  You lose your appetite.  You develop acne.  You have an increase in body and facial hair.  You are gaining weight, without changing your exercise and eating habits.  You think you are pregnant. SEEK IMMEDIATE MEDICAL CARE IF:   You have increasing abdominal pain.  You feel sick to your stomach (nauseous), and you throw up (vomit).  You develop a fever that comes on suddenly.  You have abdominal pain during a bowel movement.  Your menstrual periods become heavier than usual. MAKE SURE YOU:  Understand these instructions.  Will watch your condition.  Will get help right away if you are not doing well or get worse. Document Released: 09/27/2005 Document Revised: 10/02/2013 Document Reviewed: 06/04/2013 [facility] Patient Information 2015 [employer] [place]. This information is not intended to replace advice given to you by your health care provider. Make sure you discuss any questions you have with your health care provider. Cholelithiasis Cholelithiasis (also called gallstones) is a form of gallbladder disease in which gallstones form in your gallbladder. The gallbladder is an organ that stores bile made in the liver, which helps digest fats. Gallstones begin as small crystals and slowly grow into stones. Gallstone pain occurs when the gallbladder spasms and a gallstone is blocking the duct. Pain can also occur when a stone passes out of the duct.  RISK FACTORS  Being female.   Having multiple pregnancies. Health care providers sometimes advise removing diseased gallbladders before future pregnancies.   Being obese.  Eating a diet heavy in fried foods and fat.    Being older than 60 years and increasing age.   Prolonged use of medicines containing female hormones.   Having diabetes mellitus.   Rapidly losing weight.   Having a family history of gallstones (heredity).  SYMPTOMS  Nausea.   Vomiting.  Abdominal pain.   Yellowing of the skin (jaundice).   Sudden pain. It may persist from several minutes to several hours.  Fever.   Tenderness to the touch. In some cases, when gallstones do not move into the bile duct, people have no pain or symptoms. These are called "silent" gallstones.  TREATMENT Silent gallstones do not need treatment. In severe cases, emergency surgery may be required. Options for treatment include:  Surgery to remove the gallbladder. This is the most common treatment.  Medicines. These do not always work and may take 6-12 months or more to work.  Shock wave treatment (extracorporeal biliary lithotripsy). In this treatment an ultrasound machine sends shock waves to the gallbladder to break gallstones into smaller pieces that can pass into the intestines or be dissolved by medicine. HOME CARE INSTRUCTIONS   Only take over-the-counter or prescription medicines for pain, discomfort, or fever as directed by your health care provider.   Follow a low-fat diet until seen again by your health care provider. Fat causes  the gallbladder to contract, which can result in pain.   Follow up with your health care provider as directed. Attacks are almost always recurrent and surgery is usually required for permanent treatment.  SEEK IMMEDIATE MEDICAL CARE IF:   Your pain increases and is not controlled by medicines.   You have a fever or persistent symptoms for more than 2-3 days.   You have a fever and your symptoms suddenly get worse.   You have persistent nausea and vomiting.  MAKE SURE YOU:   Understand these instructions.  Will watch your condition.  Will get help right away if you are not doing  well or get worse. Document Released: 09/23/2005 Document Revised: 05/30/2013 Document Reviewed: 03/21/2013 [facility] Patient Information 2015 [place], [place]. This information is not intended to replace advice given to you by your health care provider. Make sure you discuss any questions you have with your health care provider.

## 2014-12-16 ENCOUNTER — Encounter (HOSPITAL_COMMUNITY): Payer: Self-pay | Admitting: *Deleted

## 2014-12-16 ENCOUNTER — Inpatient Hospital Stay (HOSPITAL_COMMUNITY)
Admission: AD | Admit: 2014-12-16 | Discharge: 2014-12-16 | Disposition: A | Discharge status: Home / Self Care | Payer: No Typology Code available for payment source | Source: Ambulatory Visit | Attending: Obstetrics & Gynecology | Admitting: Obstetrics & Gynecology

## 2014-12-16 DIAGNOSIS — K8020 Calculus of gallbladder without cholecystitis without obstruction: Secondary | ICD-10-CM | POA: Diagnosis not present

## 2014-12-16 DIAGNOSIS — A084 Viral intestinal infection, unspecified: Secondary | ICD-10-CM | POA: Insufficient documentation

## 2014-12-16 DIAGNOSIS — R1011 Right upper quadrant pain: Secondary | ICD-10-CM | POA: Insufficient documentation

## 2014-12-16 DIAGNOSIS — N8320 Unspecified ovarian cysts: Secondary | ICD-10-CM | POA: Insufficient documentation

## 2014-12-16 LAB — COMPREHENSIVE METABOLIC PANEL
ALT: 27 U/L (ref 0–35)
AST: 23 U/L (ref 0–37)
Albumin: 3.5 g/dL (ref 3.5–5.2)
Alkaline Phosphatase: 45 U/L (ref 39–117)
Anion gap: 5 (ref 5–15)
BUN: 14 mg/dL (ref 6–23)
CO2: 26 mmol/L (ref 19–32)
Calcium: 8.5 mg/dL (ref 8.4–10.5)
Chloride: 106 mmol/L (ref 96–112)
Creatinine, Ser: 1 mg/dL (ref 0.50–1.10)
GFR calc Af Amer: 87 mL/min — ABNORMAL LOW (ref >90)
GFR calc non Af Amer: 75 mL/min — ABNORMAL LOW (ref >90)
Glucose, Bld: 86 mg/dL (ref 70–99)
Potassium: 4 mmol/L (ref 3.5–5.1)
Sodium: 137 mmol/L (ref 135–145)
Total Bilirubin: 0.6 mg/dL (ref 0.3–1.2)
Total Protein: 6.6 g/dL (ref 6.0–8.3)

## 2014-12-16 LAB — CBC
HCT: 37.3 % (ref 36.0–46.0)
Hemoglobin: 12.3 g/dL (ref 12.0–15.0)
MCH: 29.2 pg (ref 26.0–34.0)
MCHC: 33 g/dL (ref 30.0–36.0)
MCV: 88.6 fL (ref 78.0–100.0)
Platelets: 254 10*3/uL (ref 150–400)
RBC: 4.21 MIL/uL (ref 3.87–5.11)
RDW: 12.8 % (ref 11.5–15.5)
WBC: 4.3 10*3/uL (ref 4.0–10.5)

## 2014-12-16 LAB — URINALYSIS, ROUTINE W REFLEX MICROSCOPIC
Bilirubin Urine: NEGATIVE
Glucose, UA: NEGATIVE mg/dL
Hgb urine dipstick: NEGATIVE
Ketones, ur: 15 mg/dL — AB
Leukocytes, UA: NEGATIVE
Nitrite: NEGATIVE
Protein, ur: NEGATIVE mg/dL
Specific Gravity, Urine: 1.02 (ref 1.005–1.030)
Urobilinogen, UA: 1 mg/dL (ref 0.0–1.0)
pH: 5.5 (ref 5.0–8.0)

## 2014-12-16 LAB — POCT PREGNANCY, URINE: Preg Test, Ur: NEGATIVE

## 2014-12-16 LAB — WET PREP, GENITAL
Clue Cells Wet Prep HPF POC: NONE SEEN
Trich, Wet Prep: NONE SEEN
Yeast Wet Prep HPF POC: NONE SEEN

## 2014-12-16 LAB — LIPASE, BLOOD: Lipase: 26 U/L (ref 11–59)

## 2014-12-16 LAB — AMYLASE: Amylase: 79 U/L (ref 0–105)

## 2014-12-16 MED ORDER — KETOROLAC TROMETHAMINE 60 MG/2ML IM SOLN
60.0000 mg | Freq: Once | INTRAMUSCULAR | Status: AC
  Administered 2014-12-16: 60 mg via INTRAMUSCULAR
  Filled 2014-12-16: qty 2

## 2014-12-16 MED ORDER — ONDANSETRON 8 MG PO TBDP
8.0000 mg | ORAL_TABLET | Freq: Once | ORAL | Status: AC
  Administered 2014-12-16: 8 mg via ORAL
  Filled 2014-12-16: qty 1

## 2014-12-16 MED ORDER — ONDANSETRON 8 MG PO TBDP
8.0000 mg | ORAL_TABLET | Freq: Three times a day (TID) | ORAL | Status: DC | PRN
Start: 2014-12-16 — End: 2015-02-15

## 2014-12-16 MED ORDER — GI COCKTAIL ~~LOC~~
30.0000 mL | Freq: Once | ORAL | Status: AC
  Administered 2014-12-16: 30 mL via ORAL
  Filled 2014-12-16: qty 30

## 2014-12-16 NOTE — MAU Note (Signed)
Pt complains of abd pain RLQ, and back. Pt has nausea with 3x diarrhea. Cloudy vaginal discharge with itch, denies bleeding.

## 2014-12-16 NOTE — Discharge Instructions (Signed)
Low-Fat Diet for Pancreatitis or Gallbladder Conditions A low-fat diet can be helpful if you have pancreatitis or a gallbladder condition. With these conditions, your pancreas and gallbladder have trouble digesting fats. A healthy eating plan with less fat will help rest your pancreas and gallbladder and reduce your symptoms. WHAT DO I NEED TO KNOW ABOUT THIS DIET?  Eat a low-fat diet.  Reduce your fat intake to less than 20-30% of your total daily calories. This is less than 50-60 g of fat per day.  Remember that you need some fat in your diet. Ask your dietician what your daily goal should be.  Choose nonfat and low-fat healthy foods. Look for the words "nonfat," "low fat," or "fat free."  As a guide, look on the label and choose foods with less than 3 g of fat per serving. Eat only one serving.  Avoid alcohol.  Do not smoke. If you need help quitting, talk with your health care provider.  Eat small frequent meals instead of three large heavy meals. WHAT FOODS CAN I EAT? Grains Include healthy grains and starches such as potatoes, wheat bread, fiber-rich cereal, and brown rice. Choose whole grain options whenever possible. In adults, whole grains should account for 45-65% of your daily calories.  Fruits and Vegetables Eat plenty of fruits and vegetables. Fresh fruits and vegetables add fiber to your diet. Meats and Other Protein Sources Eat lean meat such as chicken and pork. Trim any fat off of meat before cooking it. Eggs, fish, and beans are other sources of protein. In adults, these foods should account for 10-35% of your daily calories. Dairy Choose low-fat milk and dairy options. Dairy includes fat and protein, as well as calcium.  Fats and Oils Limit high-fat foods such as fried foods, sweets, baked goods, sugary drinks.  Other Creamy sauces and condiments, such as mayonnaise, can add extra fat. Think about whether or not you need to use them, or use smaller amounts or low fat  options. WHAT FOODS ARE NOT RECOMMENDED?  High fat foods, such as:  [employer].  Ice cream.  [place] toast.  Sweet rolls.  Pizza.  Cheese bread.  Foods covered with batter, butter, creamy sauces, or cheese.  Fried foods.  Sugary drinks and desserts.  Foods that cause gas or bloating Document Released: 10/02/2013 Document Reviewed: 10/02/2013 [facility] Patient Information 2015 [place], [place]. This information is not intended to replace advice given to you by your health care provider. Make sure you discuss any questions you have with your health care provider.  Food Choices to Help Relieve Diarrhea When you have diarrhea, the foods you eat and your eating habits are very important. Choosing the right foods and drinks can help relieve diarrhea. Also, because diarrhea can last up to 7 days, you need to replace lost fluids and electrolytes (such as sodium, potassium, and chloride) in order to help prevent dehydration.  WHAT GENERAL GUIDELINES DO I NEED TO FOLLOW?  Slowly drink 1 cup (8 oz) of fluid for each episode of diarrhea. If you are getting enough fluid, your urine will be clear or pale yellow.  Eat starchy foods. Some good choices include white rice, white toast, pasta, low-fiber cereal, baked potatoes (without the skin), saltine crackers, and bagels.  Avoid large servings of any cooked vegetables.  Limit fruit to two servings per day. A serving is  cup or 1 small piece.  Choose foods with less than 2 g of fiber per serving.  Limit fats to less  than 8 tsp (38 g) per day.  Avoid fried foods.  Eat foods that have probiotics in them. Probiotics can be found in certain dairy products.  Avoid foods and beverages that may increase the speed at which food moves through the stomach and intestines (gastrointestinal tract). Things to avoid include:  High-fiber foods, such as dried fruit, raw fruits and vegetables, nuts, seeds, and whole grain foods.  Spicy foods and  high-fat foods.  Foods and beverages sweetened with high-fructose corn syrup, honey, or sugar alcohols such as xylitol, sorbitol, and mannitol. WHAT FOODS ARE RECOMMENDED? Grains White rice. White, [place], or pita breads (fresh or toasted), including plain rolls, buns, or bagels. White pasta. Saltine, soda, or graham crackers. Pretzels. Low-fiber cereal. Cooked cereals made with water (such as cornmeal, farina, or cream cereals). Plain muffins. Matzo. Melba toast. Zwieback.  Vegetables Potatoes (without the skin). Strained tomato and vegetable juices. Most well-cooked and canned vegetables without seeds. Tender lettuce. Fruits Cooked or canned applesauce, apricots, cherries, fruit cocktail, grapefruit, peaches, pears, or plums. Fresh bananas, apples without skin, cherries, grapes, cantaloupe, grapefruit, peaches, oranges, or plums.  Meat and Other Protein Products Baked or boiled chicken. Eggs. Tofu. Fish. Seafood. Smooth peanut butter. Ground or well-cooked tender beef, ham, veal, lamb, pork, or poultry.  Dairy Plain yogurt, kefir, and unsweetened liquid yogurt. Lactose-free milk, buttermilk, or soy milk. Plain hard cheese. Beverages Sport drinks. Clear broths. Diluted fruit juices (except prune). Regular, caffeine-free sodas such as ginger ale. Water. Decaffeinated teas. Oral rehydration solutions. Sugar-free beverages not sweetened with sugar alcohols. Other Bouillon, broth, or soups made from recommended foods.  The items listed above may not be a complete list of recommended foods or beverages. Contact your dietitian for more options. WHAT FOODS ARE NOT RECOMMENDED? Grains Whole grain, whole wheat, bran, or rye breads, rolls, pastas, crackers, and cereals. Wild or brown rice. Cereals that contain more than 2 g of fiber per serving. Corn tortillas or taco shells. Cooked or dry oatmeal. Granola. Popcorn. Vegetables Raw vegetables. Cabbage, broccoli, Brussels sprouts, artichokes, baked  beans, beet greens, corn, kale, legumes, peas, sweet potatoes, and yams. Potato skins. Cooked spinach and cabbage. Fruits Dried fruit, including raisins and dates. Raw fruits. Stewed or dried prunes. Fresh apples with skin, apricots, mangoes, pears, raspberries, and strawberries.  Meat and Other Protein Products Chunky peanut butter. Nuts and seeds. Beans and lentils. Tomasa Blase.  Dairy High-fat cheeses. Milk, chocolate milk, and beverages made with milk, such as milk shakes. Cream. Ice cream. Sweets and Desserts Sweet rolls, doughnuts, and sweet breads. Pancakes and waffles. Fats and Oils Butter. Cream sauces. Margarine. Salad oils. Plain salad dressings. Olives. Avocados.  Beverages Caffeinated beverages (such as coffee, tea, soda, or energy drinks). Alcoholic beverages. Fruit juices with pulp. Prune juice. Soft drinks sweetened with high-fructose corn syrup or sugar alcohols. Other Coconut. Hot sauce. Chili powder. Mayonnaise. Gravy. Cream-based or milk-based soups.  The items listed above may not be a complete list of foods and beverages to avoid. Contact your dietitian for more information. WHAT SHOULD I DO IF I BECOME DEHYDRATED? Diarrhea can sometimes lead to dehydration. Signs of dehydration include dark urine and dry mouth and skin. If you think you are dehydrated, you should rehydrate with an oral rehydration solution. These solutions can be purchased at pharmacies, retail stores, or online.  Drink -1 cup (120-240 mL) of oral rehydration solution each time you have an episode of diarrhea. If drinking this amount makes your diarrhea worse, try drinking smaller amounts more  often. For example, drink 1-3 tsp (5-15 mL) every 5-10 minutes.  A general rule for staying hydrated is to drink 1-2 L of fluid per day. Talk to your health care provider about the specific amount you should be drinking each day. Drink enough fluids to keep your urine clear or pale yellow. Document Released: 12/18/2003  Document Revised: 10/02/2013 Document Reviewed: 08/20/2013 [facility] Patient Information 2015 [place], [place]. This information is not intended to replace advice given to you by your health care provider. Make sure you discuss any questions you have with your health care provider.

## 2014-12-16 NOTE — MAU Note (Signed)
Pt states gallstones, and ovarian cysts

## 2014-12-16 NOTE — MAU Provider Note (Signed)
History     CSN: [id number]  Arrival date and time: 12/16/14 1320   First Provider Initiated Contact with Patient 12/16/14 1416      No chief complaint on file.  HPI   Javana Schey is a 29 YOBF with hx of obesity and cholelithiasis who presents with RUQ pain. Pt reports dull RUQ pain since she was diagnosed with cholelithiasis in 12/15. She denies surgical history other than a "knee operation." Last night the pain became much worse after eating some popcorn. The pain is now burning and radiates to her back. Pain is reported 10/10. She tried some Pepto bismol that did not help. Pain is slightly alleviated by lying on her right side. She also has associated nausea and vomiting for the past month with last emesis 4 days ago. She reports green diarrhea that began this morning. She is concerned for pregnancy because her breasts have been aching and LMP was more than 2 months ago. She has had Implanon implant for 5 years. She is concerned for STD due to history of trichomoniasis multiple times and having had unprotected sex 1 month ago.    Patient has known gallstones from a CT on 10/08/14. She also reports that her family members have been sick with GI illness.   OB History    Gravida Para Term Preterm AB TAB SAB Ectopic Multiple Living   Past Medical History  Diagnosis Date  . Asthma   . Dysrhythmia, cardiac   . Kidney stone   . Chlamydia     Pt reports treated 12/2012; no further contact with that partner    Past Surgical History  Procedure Laterality Date  . Knee surgery      Family History  Problem Relation Age of Onset  . Stroke Mother   . Gout Father     History  Substance Use Topics  . Smoking status: Never Smoker   . Smokeless tobacco: Never Used  . Alcohol Use: No     Comment: social    Allergies: No Known Allergies  Prescriptions prior to admission  Medication Sig Dispense Refill Last Dose  . bismuth subsalicylate (PEPTO BISMOL) 262  MG/15ML suspension Take 30 mLs by mouth every 6 (six) hours as needed for indigestion (upset stomach).   12/16/2014 at Unknown time  . ibuprofen (ADVIL,MOTRIN) 200 MG tablet Take 800 mg by mouth every 6 (six) hours as needed for headache or moderate pain.   Past Month at Unknown time  . omeprazole (PRILOSEC OTC) 20 MG tablet Take 20 mg by mouth daily as needed (acid reflux).   Past Week at Unknown time  . Etonogestrel (IMPLANON [place]) Inject 1 each into the skin once. Placed in 2011.   10/08/2014 at Unknown time  . meloxicam (MOBIC) 15 MG tablet Take 1 tablet (15 mg total) by mouth daily. For 1 week, then as needed (Patient not taking: Reported on 12/16/2014) 30 tablet 0 Past Week at Unknown time  . metroNIDAZOLE (FLAGYL) 500 MG tablet Take 1 tablet (500 mg total) by mouth 2 (two) times daily. (Patient not taking: Reported on 10/08/2014) 14 tablet 0 Not Taking at Unknown time  . ondansetron (ZOFRAN ODT) 4 MG disintegrating tablet  ODT q4 hours prn nausea/vomit (Patient not taking: Reported on 12/16/2014) 4 tablet 0   . oxyCODONE-acetaminophen (PERCOCET/ROXICET) 5-325 MG per tablet Take 1 tablet by mouth every 6 (six) hours as needed for severe pain. (  Patient not taking: Reported on 12/16/2014) 15 tablet 0     Review of Systems  Constitutional: Negative for fever, chills, weight loss and diaphoresis.  Respiratory: Negative for cough and hemoptysis.   Cardiovascular: Negative for chest pain and orthopnea.       Reports hx of palpitations, none currently  Gastrointestinal: Positive for nausea, vomiting, abdominal pain and diarrhea. Negative for heartburn, constipation and blood in stool.  Genitourinary: Positive for frequency. Negative for dysuria, urgency and hematuria.  Neurological: Positive for headaches. Negative for dizziness, tingling and loss of consciousness.  Psychiatric/Behavioral: Negative for depression and substance abuse. The patient is not nervous/anxious.    Physical Exam   Blood pressure  123/82, pulse 88, temperature 98.8 F (37.1 C), temperature source Oral, resp. rate 20, height  (1.854 m), weight 141.25 kg (311 lb 6.4 oz), last menstrual period 10/27/2014.  Physical Exam  Constitutional: She is oriented to person, place, and time. She appears well-developed and well-nourished. No distress.  Morbidly obese  HENT:  Head: Normocephalic and atraumatic.  Right Ear: External ear normal.  Left Ear: External ear normal.  Eyes: Conjunctivae and EOM are normal. Pupils are equal, round, and reactive to light.  Neck: Normal range of motion. Neck supple.  Cardiovascular: Normal rate, regular rhythm, normal heart sounds and intact distal pulses.   Respiratory: Effort normal and breath sounds normal.  GI: Soft. Bowel sounds are normal. She exhibits no distension, no abdominal bruit, no ascites and no mass. There is no hepatosplenomegaly. There is tenderness in the right upper quadrant. There is guarding and positive Murphy's sign. There is no rigidity, no rebound, no CVA tenderness and no tenderness at McBurney's point. No hernia.    Musculoskeletal: Normal range of motion.  Neurological: She is alert and oriented to person, place, and time. No cranial nerve deficit.  Skin: Skin is warm and dry. She is not diaphoretic.  Psychiatric: She has a normal mood and affect. Her behavior is normal.    MAU Course  Procedures  MDM  Miko Markwood is a 29 YOBF with hx of obesity and cholelithiasis who presents with 1 day of acute, burning RUQ pain since last night after eating some popcorn. RUQ pain present since dx of cholelithiasis on 12/15 via CT but has worsened and now associated with nausea and vomiting. TTP of RUQ on exam with guarding and positive Murphy's sign. UPT negative. STD testing with GC/C swab.  Results for orders placed or performed during the hospital encounter of 12/16/14 (from the past 24 hour(s))  Pregnancy, urine POC     Status: None   Collection Time: 12/16/14   1:38 PM  Result Value Ref Range   Preg Test, Ur NEGATIVE NEGATIVE  Urinalysis, Routine w reflex microscopic     Status: Abnormal   Collection Time: 12/16/14  2:33 PM  Result Value Ref Range   Color, Urine YELLOW YELLOW   APPearance CLEAR CLEAR   Specific Gravity, Urine 1.020 1.005 - 1.030   pH 5.5 5.0 - 8.0   Glucose, UA NEGATIVE NEGATIVE mg/dL   Hgb urine dipstick NEGATIVE NEGATIVE   Bilirubin Urine NEGATIVE NEGATIVE   Ketones, ur 15 (A) NEGATIVE mg/dL   Protein, ur NEGATIVE NEGATIVE mg/dL   Urobilinogen, UA 1.0 0.0 - 1.0 mg/dL   Nitrite NEGATIVE NEGATIVE   Leukocytes, UA NEGATIVE NEGATIVE  Wet prep, genital     Status: Abnormal   Collection Time: 12/16/14  2:45 PM  Result Value Ref Range   Yeast Wet  Prep HPF POC NONE SEEN NONE SEEN   Trich, Wet Prep NONE SEEN NONE SEEN   Clue Cells Wet Prep HPF POC NONE SEEN NONE SEEN   WBC, Wet Prep HPF POC FEW (A) NONE SEEN  CBC     Status: None   Collection Time: 12/16/14  3:02 PM  Result Value Ref Range   WBC 4.3 4.0 - 10.5 K/uL   RBC 4.21 3.87 - 5.11 MIL/uL   Hemoglobin 12.3 12.0 - 15.0 g/dL   HCT 16.137.3 09.636.0 - 04.546.0 %   MCV 88.6 78.0 - 100.0 fL   MCH 29.2 26.0 - 34.0 pg   MCHC 33.0 30.0 - 36.0 g/dL   RDW 40.912.8 81.111.5 - 91.415.5 %   Platelets 254 150 - 400 K/uL  Comprehensive metabolic panel     Status: Abnormal   Collection Time: 12/16/14  3:02 PM  Result Value Ref Range   Sodium 137 135 - 145 mmol/L   Potassium 4.0 3.5 - 5.1 mmol/L   Chloride 106 96 - 112 mmol/L   CO2 26 19 - 32 mmol/L   Glucose, Bld 86 70 - 99 mg/dL   BUN 14 6 - 23 mg/dL   Creatinine, Ser 7.821.00 0.50 - 1.10 mg/dL   Calcium 8.5 8.4 - 95.610.5 mg/dL   Total Protein 6.6 6.0 - 8.3 g/dL   Albumin 3.5 3.5 - 5.2 g/dL   AST 23 0 - 37 U/L   ALT 27 0 - 35 U/L   Alkaline Phosphatase 45 39 - 117 U/L   Total Bilirubin 0.6 0.3 - 1.2 mg/dL   GFR calc non Af Amer 75 (L) >90 mL/min   GFR calc Af Amer 87 (L) >90 mL/min   Anion gap 5 5 - 15  Amylase     Status: None   Collection  Time: 12/16/14  3:02 PM  Result Value Ref Range   Amylase 79 0 - 105 U/L  Lipase, blood     Status: None   Collection Time: 12/16/14  3:02 PM  Result Value Ref Range   Lipase 26 11 - 59 U/L   1630: Patient reports pain has improved with toradol and GI cocktail.  Assessment and Plan  A:  1. Viral gastroenteritis   2. Calculus of gallbladder without cholecystitis without obstruction     P: DC home Zofran PRN FU with ED if sx worsen    Acie FredricksonShepherd, Bryson 12/16/2014, 2:40 PM   CNM attestation:  I have seen and examined this patient; I agree with above documentation in the PA student's note.   Horald ChestnutSynethia R Laidlaw is a 29 y.o. [id number] reporting RUQ pain and diarrhea. She states that she has had sick contacts who have had diarrhea. She denies fever. Known gallstones.   PE: BP 122/72 mmHg  Pulse 90  Temp(Src) 98.4 F (36.9 C) (Oral)  Resp 20  Ht 6\' 1"  (1.854 m)  Wt 141.25 kg (311 lb 6.4 oz)  BMI 41.09 kg/m2  LMP 10/27/2014 (Exact Date) Gen: calm comfortable, NAD Resp: normal effort, no distress Abd: non-tender   ROS, labs, PMH reviewed   Plan: DC home Zofran PRN FU with ED if sx worsen   Tawnya CrookHogan, Quentin Shorey Donovan, CNM 4:56 PM

## 2014-12-17 LAB — HIV ANTIBODY (ROUTINE TESTING W REFLEX): HIV Screen 4th Generation wRfx: NONREACTIVE

## 2014-12-17 LAB — GC/CHLAMYDIA PROBE AMP (~~LOC~~) NOT AT ARMC
Chlamydia: NEGATIVE
Neisseria Gonorrhea: NEGATIVE

## 2015-01-10 ENCOUNTER — Ambulatory Visit: Payer: No Typology Code available for payment source | Admitting: Obstetrics & Gynecology

## 2015-02-15 ENCOUNTER — Encounter (HOSPITAL_COMMUNITY): Payer: Self-pay | Admitting: Emergency Medicine

## 2015-02-15 ENCOUNTER — Emergency Department (INDEPENDENT_AMBULATORY_CARE_PROVIDER_SITE_OTHER)
Admission: EM | Admit: 2015-02-15 | Discharge: 2015-02-15 | Disposition: A | Discharge status: Home / Self Care | Payer: No Typology Code available for payment source | Source: Home / Self Care | Attending: Family Medicine | Admitting: Family Medicine

## 2015-02-15 DIAGNOSIS — Z8719 Personal history of other diseases of the digestive system: Secondary | ICD-10-CM

## 2015-02-15 DIAGNOSIS — R110 Nausea: Secondary | ICD-10-CM | POA: Diagnosis not present

## 2015-02-15 DIAGNOSIS — R1011 Right upper quadrant pain: Secondary | ICD-10-CM | POA: Diagnosis not present

## 2015-02-15 LAB — POCT URINALYSIS DIP (DEVICE)
Bilirubin Urine: NEGATIVE
Glucose, UA: NEGATIVE mg/dL
Hgb urine dipstick: NEGATIVE
Ketones, ur: NEGATIVE mg/dL
Nitrite: NEGATIVE
Protein, ur: NEGATIVE mg/dL
Specific Gravity, Urine: 1.02 (ref 1.005–1.030)
Urobilinogen, UA: 0.2 mg/dL (ref 0.0–1.0)
pH: 5.5 (ref 5.0–8.0)

## 2015-02-15 LAB — POCT PREGNANCY, URINE: Preg Test, Ur: NEGATIVE

## 2015-02-15 MED ORDER — GI COCKTAIL ~~LOC~~
30.0000 mL | Freq: Once | ORAL | Status: AC
  Administered 2015-02-15: 30 mL via ORAL

## 2015-02-15 MED ORDER — GI COCKTAIL ~~LOC~~
ORAL | Status: AC
  Filled 2015-02-15: qty 30

## 2015-02-15 MED ORDER — ONDANSETRON 8 MG PO TBDP: 8.0000 mg | ORAL_TABLET | Freq: Three times a day (TID) | ORAL | Status: DC | PRN

## 2015-02-15 NOTE — ED Notes (Signed)
C/o abd pain onset yest; hx of gallbladder problems Denies fevers, chills, urinary sx; BM are normal

## 2015-02-15 NOTE — ED Provider Notes (Signed)
CSN: [id number]     Arrival date & time 02/15/15  1125 History   First MD Initiated Contact with Patient 02/15/15 1202     Chief Complaint  Patient presents with  . Abdominal Pain   29 yo woman with known gallstones presents with RUQ pain, similar to that in past treated with phenergan and GI cocktail.  Pain began about 36 hours ago and she has had some nausea but not frank vomiting.  Avoiding liquids and food to prevent vomiting.   No diarrhea.  Daughter is currently sick with nausea, vomiting and diarrhea.  Patient is a 29 y.o. female presenting with abdominal pain. The history is provided by the patient.  Abdominal Pain Pain location:  RUQ Pain quality: aching   Pain radiates to:  Does not radiate Pain severity:  Mild Onset quality:  Gradual Duration:  2 days Timing:  Constant Progression:  Waxing and waning Chronicity:  Recurrent Relieved by:  Nothing Worsened by:  Nothing tried Ineffective treatments:  None tried Associated symptoms: anorexia, fatigue and nausea     Past Medical History  Diagnosis Date  . Asthma   . Dysrhythmia, cardiac   . Kidney stone   . Chlamydia     Pt reports treated 12/2012; no further contact with that partner   Past Surgical History  Procedure Laterality Date  . Knee surgery     Family History  Problem Relation Age of Onset  . Stroke Mother   . Gout Father    History  Substance Use Topics  . Smoking status: Never Smoker   . Smokeless tobacco: Never Used  . Alcohol Use: No     Comment: social   OB History    Gravida Para Term Preterm AB TAB SAB Ectopic Multiple Living   2 2 2       2      Review of Systems  Constitutional: Positive for fatigue.  HENT: Negative.   Eyes: Negative.   Respiratory: Negative.   Cardiovascular: Negative.   Gastrointestinal: Positive for nausea, abdominal pain and anorexia.  Genitourinary: Negative.   Musculoskeletal: Negative.   Skin: Negative.   Neurological: Negative.   Psychiatric/Behavioral:  Negative.     Allergies  Review of patient's allergies indicates no known allergies.  Home Medications   Prior to Admission medications   Medication Sig Start Date End Date Taking? Authorizing Provider  bismuth subsalicylate (PEPTO BISMOL) 262 MG/15ML suspension Take 30 mLs by mouth every 6 (six) hours as needed for indigestion (upset stomach).    Historical Provider, MD  Etonogestrel (IMPLANON [place]) Inject 1 each into the skin once. Placed in 2011.    Historical Provider, MD  ibuprofen (ADVIL,MOTRIN) 200 MG tablet Take 800 mg by mouth every 6 (six) hours as needed for headache or moderate pain.    Historical Provider, MD  omeprazole (PRILOSEC OTC) 20 MG tablet Take 20 mg by mouth daily as needed (acid reflux).    Historical Provider, MD  ondansetron (ZOFRAN ODT) 8 MG disintegrating tablet Take 1 tablet (8 mg total) by mouth every 8 (eight) hours as needed for nausea or vomiting. 12/16/14   Armando ReichertHeather D Hogan, CNM   BP 123/76 mmHg  Pulse 67  Temp(Src) 99 F (37.2 C) (Oral)  Resp 16  SpO2 98%  LMP 02/15/2015 Physical Exam  Constitutional: She is oriented to person, place, and time. She appears well-developed and well-nourished.  HENT:  Head: Normocephalic and atraumatic.  Right Ear: External ear normal.  Left Ear: External ear normal.  Mouth/Throat: Oropharynx is clear and moist.  Eyes: Conjunctivae and EOM are normal. Pupils are equal, round, and reactive to light.  Neck: Normal range of motion. Neck supple.  Cardiovascular: Normal rate, regular rhythm and normal heart sounds.   Pulmonary/Chest: Effort normal and breath sounds normal.  Abdominal: Soft. Bowel sounds are normal. She exhibits no mass. There is tenderness. There is no rebound and no guarding.  Mild RUQ tenderness  Musculoskeletal: Normal range of motion.  Neurological: She is alert and oriented to person, place, and time.  Skin: Skin is warm and dry.  Psychiatric: She has a normal mood and affect. Her behavior is normal.    Appears much older than stated age. ED Course  Procedures (including critical care time) Labs Review Labs Reviewed  POCT URINALYSIS DIP (DEVICE) - Abnormal; Notable for the following:    Leukocytes, UA MODERATE (*)    All other components within normal limits  POCT PREGNANCY, URINE    Imaging Review No results found.   MDM  RUQ abdominal pain - Plan: ondansetron (ZOFRAN ODT) 8 MG disintegrating tablet  Nausea - Plan: ondansetron (ZOFRAN ODT) 8 MG disintegrating tablet  H/O cholecystitis     Elvina SidleKurt Darrill Vreeland, MD 02/15/15 1218

## 2015-02-15 NOTE — Discharge Instructions (Signed)
Follow up with your doctor is symptoms persist.

## 2015-02-18 ENCOUNTER — Emergency Department (INDEPENDENT_AMBULATORY_CARE_PROVIDER_SITE_OTHER)
Admission: EM | Admit: 2015-02-18 | Discharge: 2015-02-18 | Disposition: A | Discharge status: Home / Self Care | Payer: No Typology Code available for payment source | Source: Home / Self Care | Attending: Family Medicine | Admitting: Family Medicine

## 2015-02-18 ENCOUNTER — Encounter (HOSPITAL_COMMUNITY): Payer: Self-pay | Admitting: Emergency Medicine

## 2015-02-18 DIAGNOSIS — J45909 Unspecified asthma, uncomplicated: Secondary | ICD-10-CM | POA: Diagnosis not present

## 2015-02-18 DIAGNOSIS — R350 Frequency of micturition: Secondary | ICD-10-CM | POA: Insufficient documentation

## 2015-02-18 DIAGNOSIS — N200 Calculus of kidney: Secondary | ICD-10-CM | POA: Diagnosis not present

## 2015-02-18 DIAGNOSIS — R300 Dysuria: Secondary | ICD-10-CM | POA: Insufficient documentation

## 2015-02-18 DIAGNOSIS — R319 Hematuria, unspecified: Secondary | ICD-10-CM | POA: Insufficient documentation

## 2015-02-18 DIAGNOSIS — N3090 Cystitis, unspecified without hematuria: Secondary | ICD-10-CM

## 2015-02-18 LAB — POCT URINALYSIS DIP (DEVICE)
Bilirubin Urine: NEGATIVE
Glucose, UA: NEGATIVE mg/dL
Ketones, ur: NEGATIVE mg/dL
Nitrite: NEGATIVE
Protein, ur: >=300 mg/dL — AB
Specific Gravity, Urine: >=1.03 (ref 1.005–1.030)
Urobilinogen, UA: 0.2 mg/dL (ref 0.0–1.0)
pH: 6.5 (ref 5.0–8.0)

## 2015-02-18 LAB — POCT PREGNANCY, URINE: Preg Test, Ur: NEGATIVE

## 2015-02-18 MED ORDER — LEVAQUIN 500 MG PO TABS: 500.0000 mg | ORAL_TABLET | Freq: Every day | ORAL | Status: DC

## 2015-02-18 MED ORDER — MORPHINE SULFATE 2 MG/ML IJ SOLN
INTRAMUSCULAR | Status: AC
  Filled 2015-02-18: qty 1

## 2015-02-18 MED ORDER — TAMSULOSIN HCL 0.4 MG PO CAPS: 0.4000 mg | ORAL_CAPSULE | Freq: Every day | ORAL | Status: DC

## 2015-02-18 MED ORDER — TRAMADOL HCL 50 MG PO TABS: 50.0000 mg | ORAL_TABLET | Freq: Once | ORAL | Status: DC

## 2015-02-18 MED ORDER — TRAMADOL HCL 50 MG PO TABS: 100.0000 mg | ORAL_TABLET | Freq: Four times a day (QID) | ORAL | Status: DC | PRN

## 2015-02-18 MED ORDER — MORPHINE SULFATE 2 MG/ML IJ SOLN: 1.0000 mg | Freq: Once | INTRAMUSCULAR | Status: DC

## 2015-02-18 NOTE — ED Provider Notes (Signed)
CSN: [id number]     Arrival date & time 02/18/15  1746 History   First MD Initiated Contact with Patient 02/18/15 1922     Chief Complaint  Patient presents with  . Hematuria    HPI   29 y/o ? known h/o Arrythmia, prior Kidney stone, Prior Chlamydia, Asthma reason he presented to urgent care center 02/15/15 She was diagnosed with potential gastritis in the setting of chronic cholecystitis. Been told in the past that she will need cholecystectomy but cannot afford this and has contacted [facility] but is still not able to afford this She presented in the past as well with nephrolithiasis without intervention She states that since she took the Maalox from last urgent care evaluation on 5/7, she has had frequency and urgency as well as small volumes of urine and dark colored urine She states that she has not had any pyelonephritis-like symptoms in the past and does not have acute pain in her kidneys or nerve flanks like she did before when she had an upper urinary tract infection or her stones She has not had any nausea vomiting and is able to keep down liquids She is however having 7/10 pain and strangury which is hard for her to manage She has had no fever nor chills  Past Medical History  Diagnosis Date  . Asthma   . Dysrhythmia, cardiac   . Kidney stone   . Chlamydia     Pt reports treated 12/2012; no further contact with that partner   Past Surgical History  Procedure Laterality Date  . Knee surgery     Family History  Problem Relation Age of Onset  . Stroke Mother   . Gout Father    History  Substance Use Topics  . Smoking status: Never Smoker   . Smokeless tobacco: Never Used  . Alcohol Use: No     Comment: social   OB History    Gravida Para Term Preterm AB TAB SAB Ectopic Multiple Living   2 2 2       2      Review of Systems  Allergies  Review of patient's allergies indicates no known allergies.  Home Medications   Prior to Admission medications    Medication Sig Start Date End Date Taking? Authorizing Provider  bismuth subsalicylate (PEPTO BISMOL) 262 MG/15ML suspension Take 30 mLs by mouth every 6 (six) hours as needed for indigestion (upset stomach).    Historical Provider, MD  Etonogestrel (IMPLANON [place]) Inject 1 each into the skin once. Placed in 2011.    Historical Provider, MD  ibuprofen (ADVIL,MOTRIN) 200 MG tablet Take 800 mg by mouth every 6 (six) hours as needed for headache or moderate pain.    Historical Provider, MD  omeprazole (PRILOSEC OTC) 20 MG tablet Take 20 mg by mouth daily as needed (acid reflux).    Historical Provider, MD  ondansetron (ZOFRAN ODT) 8 MG disintegrating tablet Take 1 tablet (8 mg total) by mouth every 8 (eight) hours as needed for nausea or vomiting. 02/15/15   [name], MD   BP 121/77 mmHg  Pulse 73  Temp(Src) 98.1 F (36.7 C) (Oral)  Resp 16  SpO2 100%  LMP 01/26/2015 Physical Exam  Obese pleasant but in some amount of distress and pain EOMI NCAT Does have eczema at his changes to the face with adult acne Mallampati 2 Soft supple neck S1-S2 no murmur rub or gallop Abdomen is obese with tenderness suprapubically There is no CVA tenderness Her  exam is limited by her severe obesity Lower extremities are not swollen  ED Course  Procedures (including critical care time) Labs Review Labs Reviewed  POCT URINALYSIS DIP (DEVICE) - Abnormal; Notable for the following:    Hgb urine dipstick LARGE (*)    Protein, ur >=300 (*)    Leukocytes, UA SMALL (*)    All other components within normal limits  POCT PREGNANCY, URINE    Imaging Review No results found.   MDM  No diagnosis found.   Dysuria, strangury DDX = cystitis versus kidney stone I have recommended patient go straight to the emergency room and she may need a noncontrast CT scan.   She states that she cannot go right now she has to "complete her work at work as she took an extended lunch hour" Nevertheless if she decides  not to go as given her Flomax, tramadol as well as Levaquin and strict instructions to follow-up with her [facility] urology in the morning if she thinks she can make it there tomorrow. If not she will need to go to the emergency room and get follow-up as well as potential CT scan and lab work there Rhetta MuraSAMTANI, JAI-GURMUKH     Rhetta MuraJai-Gurmukh Winni Ehrhard, MD 02/18/15 2022

## 2015-02-18 NOTE — Discharge Instructions (Signed)
I would recommend presenting us after the emergency room if your pain gets worse If you decide to go to the urologist in the morning, please ensure that he pick up your Flomax, tramadol, Levaquin for treatment of a potential urinary tract infection in the setting of possible kidney stones

## 2015-02-18 NOTE — ED Notes (Signed)
C/o  Dysuria.  Hematuria.  Urinary frequency.   Denies fever and lower back pain.   On set yesterday.   Pt has increased water intake and tried cranberry juice with no relief.

## 2015-02-19 ENCOUNTER — Emergency Department (HOSPITAL_COMMUNITY)
Admission: EM | Admit: 2015-02-19 | Discharge: 2015-02-19 | Discharge status: Against Medical Advice | Payer: No Typology Code available for payment source | Attending: Emergency Medicine | Admitting: Emergency Medicine

## 2015-02-19 ENCOUNTER — Encounter (HOSPITAL_COMMUNITY): Payer: Self-pay | Admitting: Emergency Medicine

## 2015-02-19 LAB — URINALYSIS, ROUTINE W REFLEX MICROSCOPIC
Bilirubin Urine: NEGATIVE
Glucose, UA: NEGATIVE mg/dL
Ketones, ur: NEGATIVE mg/dL
Nitrite: POSITIVE — AB
Protein, ur: 100 mg/dL — AB
Specific Gravity, Urine: 1.022 (ref 1.005–1.030)
Urobilinogen, UA: 1 mg/dL (ref 0.0–1.0)
pH: 5.5 (ref 5.0–8.0)

## 2015-02-19 LAB — COMPREHENSIVE METABOLIC PANEL
ALT: 24 U/L (ref 14–54)
AST: 22 U/L (ref 15–41)
Albumin: 3.8 g/dL (ref 3.5–5.0)
Alkaline Phosphatase: 52 U/L (ref 38–126)
Anion gap: 11 (ref 5–15)
BUN: 16 mg/dL (ref 6–20)
CO2: 26 mmol/L (ref 22–32)
Calcium: 9.6 mg/dL (ref 8.9–10.3)
Chloride: 102 mmol/L (ref 101–111)
Creatinine, Ser: 1.3 mg/dL — ABNORMAL HIGH (ref 0.44–1.00)
GFR calc Af Amer: >60 mL/min (ref >60)
GFR calc non Af Amer: 55 mL/min — ABNORMAL LOW (ref >60)
Glucose, Bld: 97 mg/dL (ref 70–99)
Potassium: 4 mmol/L (ref 3.5–5.1)
Sodium: 139 mmol/L (ref 135–145)
Total Bilirubin: 0.6 mg/dL (ref 0.3–1.2)
Total Protein: 8.2 g/dL — ABNORMAL HIGH (ref 6.5–8.1)

## 2015-02-19 LAB — CBC WITH DIFFERENTIAL/PLATELET
Basophils Absolute: 0 10*3/uL (ref 0.0–0.1)
Basophils Relative: 1 % (ref 0–1)
Eosinophils Absolute: 0.4 10*3/uL (ref 0.0–0.7)
Eosinophils Relative: 6 % — ABNORMAL HIGH (ref 0–5)
HCT: 38.6 % (ref 36.0–46.0)
Hemoglobin: 12.2 g/dL (ref 12.0–15.0)
Lymphocytes Relative: 27 % (ref 12–46)
Lymphs Abs: 2 10*3/uL (ref 0.7–4.0)
MCH: 27.9 pg (ref 26.0–34.0)
MCHC: 31.6 g/dL (ref 30.0–36.0)
MCV: 88.3 fL (ref 78.0–100.0)
Monocytes Absolute: 0.5 10*3/uL (ref 0.1–1.0)
Monocytes Relative: 7 % (ref 3–12)
Neutro Abs: 4.4 10*3/uL (ref 1.7–7.7)
Neutrophils Relative %: 59 % (ref 43–77)
Platelets: 278 10*3/uL (ref 150–400)
RBC: 4.37 MIL/uL (ref 3.87–5.11)
RDW: 12.7 % (ref 11.5–15.5)
WBC: 7.4 10*3/uL (ref 4.0–10.5)

## 2015-02-19 LAB — POC URINE PREG, ED: Preg Test, Ur: NEGATIVE

## 2015-02-19 LAB — URINE MICROSCOPIC-ADD ON

## 2015-02-19 NOTE — ED Notes (Signed)
Pt to Nurse First reporting nausea; offered medication; pt refused.

## 2015-02-19 NOTE — ED Notes (Signed)
Pt requesting pain medications; offered medication from protocol orders; pt refused; pt is driving and has no other way home. Informed pt of delay

## 2015-02-19 NOTE — ED Notes (Signed)
Pt. reports hematuria , urinary frequency and dysuria onset this week with mild low back and bladder pain , denies fever or chills.

## 2015-02-19 NOTE — ED Notes (Signed)
Patient called at triage x3 for room with no answer.

## 2015-04-16 ENCOUNTER — Encounter (HOSPITAL_COMMUNITY): Payer: Self-pay | Admitting: Emergency Medicine

## 2015-04-16 ENCOUNTER — Emergency Department (INDEPENDENT_AMBULATORY_CARE_PROVIDER_SITE_OTHER)
Admission: EM | Admit: 2015-04-16 | Discharge: 2015-04-16 | Disposition: A | Discharge status: Home / Self Care | Payer: No Typology Code available for payment source | Source: Home / Self Care | Attending: Family Medicine | Admitting: Family Medicine

## 2015-04-16 DIAGNOSIS — M25561 Pain in right knee: Secondary | ICD-10-CM

## 2015-04-16 DIAGNOSIS — G5791 Unspecified mononeuropathy of right lower limb: Secondary | ICD-10-CM

## 2015-04-16 DIAGNOSIS — G8929 Other chronic pain: Secondary | ICD-10-CM

## 2015-04-16 DIAGNOSIS — M792 Neuralgia and neuritis, unspecified: Secondary | ICD-10-CM

## 2015-04-16 MED ORDER — MELOXICAM 7.5 MG PO TABS: 7.5000 mg | ORAL_TABLET | Freq: Two times a day (BID) | ORAL | Status: DC

## 2015-04-16 NOTE — ED Provider Notes (Signed)
CSN: [id number]     Arrival date & time 04/16/15  1510 History   First MD Initiated Contact with Patient 04/16/15 1630     No chief complaint on file.  (Consider location/radiation/quality/duration/timing/severity/associated sxs/prior Treatment) Patient is a 29 y.o. female presenting with toe pain. The history is provided by the patient.  Toe Pain This is a new problem. The current episode started 2 days ago. The problem has been gradually improving. Associated symptoms comments: Onset after starting new job and wearing steel toe shoes with inserts and prolonged standing on job. Also affecting right knee..    Past Medical History  Diagnosis Date  . Asthma   . Dysrhythmia, cardiac   . Kidney stone   . Chlamydia     Pt reports treated 12/2012; no further contact with that partner   Past Surgical History  Procedure Laterality Date  . Knee surgery     Family History  Problem Relation Age of Onset  . Stroke Mother   . Gout Father    History  Substance Use Topics  . Smoking status: Never Smoker   . Smokeless tobacco: Never Used  . Alcohol Use: No     Comment: social   OB History    Gravida Para Term Preterm AB TAB SAB Ectopic Multiple Living   Review of Systems  Constitutional: Negative.   Musculoskeletal: Negative for joint swelling and gait problem.  Skin: Negative.   Neurological: Positive for numbness.    Allergies  Review of patient's allergies indicates no known allergies.  Home Medications   Prior to Admission medications   Medication Sig Start Date End Date Taking? Authorizing Provider  bismuth subsalicylate (PEPTO BISMOL) 262 MG/15ML suspension Take 30 mLs by mouth every 6 (six) hours as needed for indigestion (upset stomach).    Historical Provider, MD  Etonogestrel (IMPLANON [place]) Inject 1 each into the skin once. Placed in 2011.    Historical Provider, MD  ibuprofen (ADVIL,MOTRIN) 200 MG tablet Take 800 mg by mouth every 6 (six) hours as  needed for headache or moderate pain.    Historical Provider, MD  LEVAQUIN 500 MG tablet Take 1 tablet (500 mg total) by mouth daily. Patient not taking: Reported on 04/16/2015 02/18/15   Rhetta Mura, MD  meloxicam (MOBIC) 7.5 MG tablet Take 1 tablet (7.5 mg total) by mouth 2 (two) times daily after a meal. 04/16/15   Linna Hoff, MD  omeprazole (PRILOSEC OTC) 20 MG tablet Take 20 mg by mouth daily as needed (acid reflux).    Historical Provider, MD  ondansetron (ZOFRAN ODT) 8 MG disintegrating tablet Take 1 tablet (8 mg total) by mouth every 8 (eight) hours as needed for nausea or vomiting. 02/15/15   Elvina Sidle, MD  tamsulosin (FLOMAX) 0.4 MG CAPS capsule Take 1 capsule (0.4 mg total) by mouth daily after supper. 02/18/15   Rhetta Mura, MD  traMADol (ULTRAM) 50 MG tablet Take 2 tablets (100 mg total) by mouth every 6 (six) hours as needed for moderate pain. 02/18/15   Rhetta Mura, MD   BP 134/83 mmHg  Pulse 74  Temp(Src) 99.5 F (37.5 C) (Oral)  Resp 18  SpO2 100%  LMP 03/28/2015 Physical Exam  Constitutional: She is oriented to person, place, and time. She appears well-developed and well-nourished.  Musculoskeletal: Normal range of motion. She exhibits no tenderness.  Neurological: She is alert and oriented to person, place, and time.  Local paresthesia to tip of right gt toe only, full rom, skin intact.  Skin: Skin is warm and dry.  Nursing note and vitals reviewed.   ED Course  Procedures (including critical care time) Labs Review Labs Reviewed - No data to display  Imaging Review No results found.   MDM   1. Neuralgia of right foot   2. Knee pain, chronic, right        Linna HoffJames D Hines Kloss, MD 04/16/15 [phone]

## 2015-04-16 NOTE — Discharge Instructions (Signed)
Change shoe inserts, use medicine as needed, see your doctor if further problems.

## 2015-05-11 ENCOUNTER — Emergency Department (HOSPITAL_COMMUNITY)
Admission: EM | Admit: 2015-05-11 | Discharge: 2015-05-11 | Disposition: A | Discharge status: Home / Self Care | Payer: No Typology Code available for payment source | Attending: Emergency Medicine | Admitting: Emergency Medicine

## 2015-05-11 ENCOUNTER — Emergency Department (HOSPITAL_COMMUNITY): Payer: No Typology Code available for payment source

## 2015-05-11 ENCOUNTER — Encounter (HOSPITAL_COMMUNITY): Payer: Self-pay | Admitting: *Deleted

## 2015-05-11 DIAGNOSIS — Z79899 Other long term (current) drug therapy: Secondary | ICD-10-CM | POA: Insufficient documentation

## 2015-05-11 DIAGNOSIS — K8020 Calculus of gallbladder without cholecystitis without obstruction: Secondary | ICD-10-CM | POA: Diagnosis not present

## 2015-05-11 DIAGNOSIS — Z87442 Personal history of urinary calculi: Secondary | ICD-10-CM | POA: Insufficient documentation

## 2015-05-11 DIAGNOSIS — A599 Trichomoniasis, unspecified: Secondary | ICD-10-CM

## 2015-05-11 DIAGNOSIS — Z3202 Encounter for pregnancy test, result negative: Secondary | ICD-10-CM | POA: Diagnosis not present

## 2015-05-11 DIAGNOSIS — R112 Nausea with vomiting, unspecified: Secondary | ICD-10-CM | POA: Diagnosis present

## 2015-05-11 DIAGNOSIS — J45909 Unspecified asthma, uncomplicated: Secondary | ICD-10-CM | POA: Diagnosis not present

## 2015-05-11 DIAGNOSIS — A5901 Trichomonal vulvovaginitis: Secondary | ICD-10-CM | POA: Insufficient documentation

## 2015-05-11 DIAGNOSIS — Z8679 Personal history of other diseases of the circulatory system: Secondary | ICD-10-CM | POA: Diagnosis not present

## 2015-05-11 HISTORY — DX: Cholecystitis, unspecified: K81.9

## 2015-05-11 LAB — WET PREP, GENITAL: Yeast Wet Prep HPF POC: NONE SEEN

## 2015-05-11 LAB — CBC
HCT: 34.5 % — ABNORMAL LOW (ref 36.0–46.0)
Hemoglobin: 11.1 g/dL — ABNORMAL LOW (ref 12.0–15.0)
MCH: 28.2 pg (ref 26.0–34.0)
MCHC: 32.2 g/dL (ref 30.0–36.0)
MCV: 87.8 fL (ref 78.0–100.0)
Platelets: 255 10*3/uL (ref 150–400)
RBC: 3.93 MIL/uL (ref 3.87–5.11)
RDW: 13.2 % (ref 11.5–15.5)
WBC: 4.2 10*3/uL (ref 4.0–10.5)

## 2015-05-11 LAB — URINALYSIS, ROUTINE W REFLEX MICROSCOPIC
Bilirubin Urine: NEGATIVE
Glucose, UA: NEGATIVE mg/dL
Hgb urine dipstick: NEGATIVE
Ketones, ur: NEGATIVE mg/dL
Nitrite: NEGATIVE
Protein, ur: NEGATIVE mg/dL
Specific Gravity, Urine: 1.017 (ref 1.005–1.030)
Urobilinogen, UA: 0.2 mg/dL (ref 0.0–1.0)
pH: 5.5 (ref 5.0–8.0)

## 2015-05-11 LAB — COMPREHENSIVE METABOLIC PANEL
ALT: 31 U/L (ref 14–54)
AST: 24 U/L (ref 15–41)
Albumin: 3.4 g/dL — ABNORMAL LOW (ref 3.5–5.0)
Alkaline Phosphatase: 44 U/L (ref 38–126)
Anion gap: 5 (ref 5–15)
BUN: 13 mg/dL (ref 6–20)
CO2: 25 mmol/L (ref 22–32)
Calcium: 8.8 mg/dL — ABNORMAL LOW (ref 8.9–10.3)
Chloride: 108 mmol/L (ref 101–111)
Creatinine, Ser: 1.13 mg/dL — ABNORMAL HIGH (ref 0.44–1.00)
GFR calc Af Amer: >60 mL/min (ref >60)
GFR calc non Af Amer: >60 mL/min (ref >60)
Glucose, Bld: 108 mg/dL — ABNORMAL HIGH (ref 65–99)
Potassium: 4 mmol/L (ref 3.5–5.1)
Sodium: 138 mmol/L (ref 135–145)
Total Bilirubin: 0.4 mg/dL (ref 0.3–1.2)
Total Protein: 7 g/dL (ref 6.5–8.1)

## 2015-05-11 LAB — LIPASE, BLOOD: Lipase: 24 U/L (ref 22–51)

## 2015-05-11 LAB — URINE MICROSCOPIC-ADD ON

## 2015-05-11 LAB — POC URINE PREG, ED: Preg Test, Ur: NEGATIVE

## 2015-05-11 MED ORDER — METRONIDAZOLE 500 MG PO TABS
2000.0000 mg | ORAL_TABLET | Freq: Once | ORAL | Status: AC
  Administered 2015-05-11: 2000 mg via ORAL
  Filled 2015-05-11: qty 4

## 2015-05-11 MED ORDER — HYDROCODONE-ACETAMINOPHEN 5-325 MG PO TABS: 1.0000 | ORAL_TABLET | Freq: Four times a day (QID) | ORAL | Status: DC | PRN

## 2015-05-11 MED ORDER — STERILE WATER FOR INJECTION IJ SOLN
INTRAMUSCULAR | Status: AC
  Filled 2015-05-11: qty 10

## 2015-05-11 MED ORDER — AZITHROMYCIN 250 MG PO TABS
1000.0000 mg | ORAL_TABLET | Freq: Once | ORAL | Status: AC
  Administered 2015-05-11: 1000 mg via ORAL
  Filled 2015-05-11: qty 4

## 2015-05-11 MED ORDER — FENTANYL CITRATE (PF) 100 MCG/2ML IJ SOLN
50.0000 ug | Freq: Once | INTRAMUSCULAR | Status: AC
  Administered 2015-05-11: 50 ug via INTRAVENOUS
  Filled 2015-05-11: qty 2

## 2015-05-11 MED ORDER — ONDANSETRON HCL 4 MG/2ML IJ SOLN
4.0000 mg | Freq: Once | INTRAMUSCULAR | Status: AC
  Administered 2015-05-11: 4 mg via INTRAVENOUS
  Filled 2015-05-11: qty 2

## 2015-05-11 MED ORDER — STERILE WATER FOR INJECTION IJ SOLN
0.9000 mL | Freq: Once | INTRAMUSCULAR | Status: AC
  Administered 2015-05-11: 0.9 mL via INTRAMUSCULAR

## 2015-05-11 MED ORDER — ONDANSETRON 4 MG PO TBDP: ORAL_TABLET | ORAL | Status: DC

## 2015-05-11 MED ORDER — CEFTRIAXONE SODIUM 250 MG IJ SOLR
250.0000 mg | Freq: Once | INTRAMUSCULAR | Status: AC
Start: 2015-05-11 — End: 2015-05-11
  Administered 2015-05-11: 250 mg via INTRAMUSCULAR
  Filled 2015-05-11: qty 250

## 2015-05-11 MED ORDER — SODIUM CHLORIDE 0.9 % IV BOLUS (SEPSIS)
1000.0000 mL | Freq: Once | INTRAVENOUS | Status: AC
  Administered 2015-05-11: 1000 mL via INTRAVENOUS

## 2015-05-11 NOTE — ED Notes (Signed)
Patient transported to Ultrasound 

## 2015-05-11 NOTE — ED Provider Notes (Signed)
CSN: [id number]     Arrival date & time 05/11/15  4098 History   First MD Initiated Contact with Patient 05/11/15 [phone]     Chief Complaint  Patient presents with  . Nausea     (Consider location/radiation/quality/duration/timing/severity/associated sxs/prior Treatment) HPI  29 year old female with nausea x 2 days. Vomited 2 yesterday. After vomiting has had recurrent RUQ abd pain. Rates as 8/10. Not nearly as severe as prior attacks attributed to her gallbladder. Nauseated now but no vomiting this AM. No diarrhea. No urinary symptoms. Has had foul smelling vaginal discharge x 1 week, states she gets recurrent BV. No intercourse recently.   Past Medical History  Diagnosis Date  . Asthma   . Dysrhythmia, cardiac   . Kidney stone   . Chlamydia     Pt reports treated 12/2012; no further contact with that partner  . Cholecystitis    Past Surgical History  Procedure Laterality Date  . Knee surgery     Family History  Problem Relation Age of Onset  . Stroke Mother   . Gout Father    History  Substance Use Topics  . Smoking status: Never Smoker   . Smokeless tobacco: Never Used  . Alcohol Use: No     Comment: social   OB History    Gravida Para Term Preterm AB TAB SAB Ectopic Multiple Living   2 2 2       2      Review of Systems  Constitutional: Negative for fever.  Cardiovascular: Negative for chest pain.  Gastrointestinal: Positive for nausea, vomiting and abdominal pain. Negative for diarrhea.  Genitourinary: Positive for vaginal discharge. Negative for dysuria and hematuria.  Musculoskeletal: Negative for back pain.  All other systems reviewed and are negative.     Allergies  Review of patient's allergies indicates no known allergies.  Home Medications   Prior to Admission medications   Medication Sig Start Date End Date Taking? Authorizing Provider  bismuth subsalicylate (PEPTO BISMOL) 262 MG/15ML suspension Take 30 mLs by mouth every 6 (six) hours as needed  for indigestion (upset stomach).    Historical Provider, MD  Etonogestrel (IMPLANON [place]) Inject 1 each into the skin once. Placed in 2011.    Historical Provider, MD  ibuprofen (ADVIL,MOTRIN) 200 MG tablet Take 800 mg by mouth every 6 (six) hours as needed for headache or moderate pain.    Historical Provider, MD  LEVAQUIN 500 MG tablet Take 1 tablet (500 mg total) by mouth daily. Patient not taking: Reported on 04/16/2015 02/18/15   Rhetta Mura, MD  meloxicam (MOBIC) 7.5 MG tablet Take 1 tablet (7.5 mg total) by mouth 2 (two) times daily after a meal. 04/16/15   Linna Hoff, MD  omeprazole (PRILOSEC OTC) 20 MG tablet Take 20 mg by mouth daily as needed (acid reflux).    Historical Provider, MD  ondansetron (ZOFRAN ODT) 8 MG disintegrating tablet Take 1 tablet (8 mg total) by mouth every 8 (eight) hours as needed for nausea or vomiting. 02/15/15   Elvina Sidle, MD  tamsulosin (FLOMAX) 0.4 MG CAPS capsule Take 1 capsule (0.4 mg total) by mouth daily after supper. 02/18/15   Rhetta Mura, MD  traMADol (ULTRAM) 50 MG tablet Take 2 tablets (100 mg total) by mouth every 6 (six) hours as needed for moderate pain. 02/18/15   Rhetta Mura, MD   BP 139/50 mmHg  Pulse 59  Temp(Src) 97.8 F (36.6 C) (Oral)  Resp 16  Ht 6\' 1"  (1.854  m)  Wt 280 lb (127.007 kg)  BMI 36.95 kg/m2  SpO2 100%  LMP 04/27/2015 Physical Exam  Constitutional: She is oriented to person, place, and time. She appears well-developed and well-nourished.  Morbidly obese  HENT:  Head: Normocephalic and atraumatic.  Right Ear: External ear normal.  Left Ear: External ear normal.  Nose: Nose normal.  Eyes: Right eye exhibits no discharge. Left eye exhibits no discharge.  Neck: Neck supple.  Cardiovascular: Normal rate, regular rhythm and normal heart sounds.   Pulmonary/Chest: Effort normal and breath sounds normal.  Abdominal: Soft. She exhibits no distension. There is tenderness in the right upper quadrant.   Genitourinary: Uterus is not tender. Cervix exhibits discharge and friability. Cervix exhibits no motion tenderness. Vaginal discharge found.  Neurological: She is alert and oriented to person, place, and time.  Skin: Skin is warm and dry.  Nursing note and vitals reviewed.   ED Course  Procedures (including critical care time) Labs Review Labs Reviewed  WET PREP, GENITAL - Abnormal; Notable for the following:    Trich, Wet Prep MANY (*)    Clue Cells Wet Prep HPF POC MODERATE (*)    WBC, Wet Prep HPF POC MANY (*)    All other components within normal limits  COMPREHENSIVE METABOLIC PANEL - Abnormal; Notable for the following:    Glucose, Bld 108 (*)    Creatinine, Ser 1.13 (*)    Calcium 8.8 (*)    Albumin 3.4 (*)    All other components within normal limits  CBC - Abnormal; Notable for the following:    Hemoglobin 11.1 (*)    HCT 34.5 (*)    All other components within normal limits  URINALYSIS, ROUTINE W REFLEX MICROSCOPIC (NOT AT [facility]) - Abnormal; Notable for the following:    APPearance CLOUDY (*)    Leukocytes, UA SMALL (*)    All other components within normal limits  LIPASE, BLOOD  URINE MICROSCOPIC-ADD ON  POC URINE PREG, ED  GC/CHLAMYDIA PROBE AMP ([place]) NOT AT [facility]    Imaging Review US Abdomen Limited  05/11/2015   CLINICAL DATA:  Right upper quadrant pain.  Cholelithiasis.  EXAM: US ABDOMEN LIMITED - RIGHT UPPER QUADRANT  COMPARISON:  CT on 10/08/2014  FINDINGS: Gallbladder:  At least 1 gallstone is seen measuring 1.7 cm. No evidence of gallbladder dilatation or wall thickening. No sonographic Murphy sign noted by sonographer.  Common bile duct:  Diameter: 3 mm, within normal limits.  Liver:  No focal lesion identified. Within normal limits in parenchymal echogenicity.  IMPRESSION: Cholelithiasis. No sonographic signs of acute cholecystitis or biliary dilatation.   Electronically Signed   By: Myles Rosenthal M.D.   On: 05/11/2015 10:14     EKG  Interpretation None      MDM   Final diagnoses:  Nausea and vomiting in adult  Calculus of gallbladder without cholecystitis without obstruction  Trichimoniasis    Patient's nausea and pain have completely resolved after one dose of medicines. This could be related to her cholelithiasis versus gastritis. No cholecystitis or indication for emergent surgery. No lower abdominal symptoms. GU exam shows friability but patient states she has had abnormal pap smears multiple times and this could be what I am seeing. After discussion she would like to be treated for cervicits and will f/u with her OB/GYN. Has trich as well, given 2g flagyl PO. She requests SW consult due to difficulty affording appointments and needing surgical follow up, will obtain this and discharge.  Pricilla Loveless, MD 05/11/15 1224

## 2015-05-11 NOTE — ED Notes (Signed)
Pt states nausea and "knawing" feeling to stomach x 2 days.  Hx of "gall bladder issues".  Emesis x 1 yesterday.  Denies diarrhea for constipation.

## 2015-05-11 NOTE — Discharge Instructions (Signed)
Abdominal Pain Many things can cause abdominal pain. Usually, abdominal pain is not caused by a disease and will improve without treatment. It can often be observed and treated at home. Your health care provider will do a physical exam and possibly order blood tests and X-rays to help determine the seriousness of your pain. However, in many cases, more time must pass before a clear cause of the pain can be found. Before that point, your health care provider may not know if you need more testing or further treatment. HOME CARE INSTRUCTIONS  Monitor your abdominal pain for any changes. The following actions may help to alleviate any discomfort you are experiencing:  Only take over-the-counter or prescription medicines as directed by your health care provider.  Do not take laxatives unless directed to do so by your health care provider.  Try a clear liquid diet (broth, tea, or water) as directed by your health care provider. Slowly move to a bland diet as tolerated. SEEK MEDICAL CARE IF:  You have unexplained abdominal pain.  You have abdominal pain associated with nausea or diarrhea.  You have pain when you urinate or have a bowel movement.  You experience abdominal pain that wakes you in the night.  You have abdominal pain that is worsened or improved by eating food.  You have abdominal pain that is worsened with eating fatty foods.  You have a fever. SEEK IMMEDIATE MEDICAL CARE IF:   Your pain does not go away within 2 hours.  You keep throwing up (vomiting).  Your pain is felt only in portions of the abdomen, such as the right side or the left lower portion of the abdomen.  You pass bloody or black tarry stools. MAKE SURE YOU:  Understand these instructions.   Will watch your condition.   Will get help right away if you are not doing well or get worse.  Document Released: 07/07/2005 Document Revised: 10/02/2013 Document Reviewed: 06/06/2013 [facility] Patient Information  2015 [place], [place]. This information is not intended to replace advice given to you by your health care provider. Make sure you discuss any questions you have with your health care provider.      Biliary Colic  Biliary colic is a steady or irregular pain in the upper abdomen. It is usually under the right side of the rib cage. It happens when gallstones interfere with the normal flow of bile from the gallbladder. Bile is a liquid that helps to digest fats. Bile is made in the liver and stored in the gallbladder. When you eat a meal, bile passes from the gallbladder through the cystic duct and the common bile duct into the small intestine. There, it mixes with partially digested food. If a gallstone blocks either of these ducts, the normal flow of bile is blocked. The muscle cells in the bile duct contract forcefully to try to move the stone. This causes the pain of biliary colic.  SYMPTOMS   A person with biliary colic usually complains of pain in the upper abdomen. This pain can be:  In the center of the upper abdomen just below the breastbone.  In the upper-right part of the abdomen, near the gallbladder and liver.  Spread back toward the right shoulder blade.  Nausea and vomiting.  The pain usually occurs after eating.  Biliary colic is usually triggered by the digestive system's demand for bile. The demand for bile is high after fatty meals. Symptoms can also occur when a person who has been fasting  suddenly eats a very large meal. Most episodes of biliary colic pass after 1 to 5 hours. After the most intense pain passes, your abdomen may continue to ache mildly for about 24 hours. DIAGNOSIS  After you describe your symptoms, your caregiver will perform a physical exam. He or she will pay attention to the upper right portion of your belly (abdomen). This is the area of your liver and gallbladder. An ultrasound will help your caregiver look for gallstones. Specialized scans of the  gallbladder may also be done. Blood tests may be done, especially if you have fever or if your pain persists. PREVENTION  Biliary colic can be prevented by controlling the risk factors for gallstones. Some of these risk factors, such as heredity, increasing age, and pregnancy are a normal part of life. Obesity and a high-fat diet are risk factors you can change through a healthy lifestyle. Women going through menopause who take hormone replacement therapy (estrogen) are also more likely to develop biliary colic. TREATMENT   Pain medication may be prescribed.  You may be encouraged to eat a fat-free diet.  If the first episode of biliary colic is severe, or episodes of colic keep retuning, surgery to remove the gallbladder (cholecystectomy) is usually recommended. This procedure can be done through small incisions using an instrument called a laparoscope. The procedure often requires a brief stay in the hospital. Some people can leave the hospital the same day. It is the most widely used treatment in people troubled by painful gallstones. It is effective and safe, with no complications in more than 90% of cases.  If surgery cannot be done, medication that dissolves gallstones may be used. This medication is expensive and can take months or years to work. Only small stones will dissolve.  Rarely, medication to dissolve gallstones is combined with a procedure called shock-wave lithotripsy. This procedure uses carefully aimed shock waves to break up gallstones. In many people treated with this procedure, gallstones form again within a few years. PROGNOSIS  If gallstones block your cystic duct or common bile duct, you are at risk for repeated episodes of biliary colic. There is also a 25% chance that you will develop a gallbladder infection(acute cholecystitis), or some other complication of gallstones within 10 to 20 years. If you have surgery, schedule it at a time that is convenient for you and at a  time when you are not sick. HOME CARE INSTRUCTIONS   Drink plenty of clear fluids.  Avoid fatty, greasy or fried foods, or any foods that make your pain worse.  Take medications as directed. SEEK MEDICAL CARE IF:   You develop a fever over 100.5 F (38.1 C).  Your pain gets worse over time.  You develop nausea that prevents you from eating and drinking.  You develop vomiting. SEEK IMMEDIATE MEDICAL CARE IF:   You have continuous or severe belly (abdominal) pain which is not relieved with medications.  You develop nausea and vomiting which is not relieved with medications.  You have symptoms of biliary colic and you suddenly develop a fever and shaking chills. This may signal cholecystitis. Call your caregiver immediately.  You develop a yellow color to your skin or the white part of your eyes (jaundice). Document Released: 02/28/2006 Document Revised: 12/20/2011 Document Reviewed: 05/09/2008 [facility] Patient Information 2015 [place], [place]. This information is not intended to replace advice given to you by your health care provider. Make sure you discuss any questions you have with your health care provider.  Nausea and Vomiting Nausea is a sick feeling that often comes before throwing up (vomiting). Vomiting is a reflex where stomach contents come out of your mouth. Vomiting can cause severe loss of body fluids (dehydration). Children and elderly adults can become dehydrated quickly, especially if they also have diarrhea. Nausea and vomiting are symptoms of a condition or disease. It is important to find the cause of your symptoms. CAUSES   Direct irritation of the stomach lining. This irritation can result from increased acid production (gastroesophageal reflux disease), infection, food poisoning, taking certain medicines (such as nonsteroidal anti-inflammatory drugs), alcohol use, or tobacco use.  Signals from the brain.These signals could be caused by a headache, heat  exposure, an inner ear disturbance, increased pressure in the brain from injury, infection, a tumor, or a concussion, pain, emotional stimulus, or metabolic problems.  An obstruction in the gastrointestinal tract (bowel obstruction).  Illnesses such as diabetes, hepatitis, gallbladder problems, appendicitis, kidney problems, cancer, sepsis, atypical symptoms of a heart attack, or eating disorders.  Medical treatments such as chemotherapy and radiation.  Receiving medicine that makes you sleep (general anesthetic) during surgery. DIAGNOSIS Your caregiver may ask for tests to be done if the problems do not improve after a few days. Tests may also be done if symptoms are severe or if the reason for the nausea and vomiting is not clear. Tests may include:  Urine tests.  Blood tests.  Stool tests.  Cultures (to look for evidence of infection).  X-rays or other imaging studies. Test results can help your caregiver make decisions about treatment or the need for additional tests. TREATMENT You need to stay well hydrated. Drink frequently but in small amounts.You may wish to drink water, sports drinks, clear broth, or eat frozen ice pops or gelatin dessert to help stay hydrated.When you eat, eating slowly may help prevent nausea.There are also some antinausea medicines that may help prevent nausea. HOME CARE INSTRUCTIONS   Take all medicine as directed by your caregiver.  If you do not have an appetite, do not force yourself to eat. However, you must continue to drink fluids.  If you have an appetite, eat a normal diet unless your caregiver tells you differently.  Eat a variety of complex carbohydrates (rice, wheat, potatoes, bread), lean meats, yogurt, fruits, and vegetables.  Avoid high-fat foods because they are more difficult to digest.  Drink enough water and fluids to keep your urine clear or pale yellow.  If you are dehydrated, ask your caregiver for specific rehydration  instructions. Signs of dehydration may include:  Severe thirst.  Dry lips and mouth.  Dizziness.  Dark urine.  Decreasing urine frequency and amount.  Confusion.  Rapid breathing or pulse. SEEK IMMEDIATE MEDICAL CARE IF:   You have blood or brown flecks (like coffee grounds) in your vomit.  You have black or bloody stools.  You have a severe headache or stiff neck.  You are confused.  You have severe abdominal pain.  You have chest pain or trouble breathing.  You do not urinate at least once every 8 hours.  You develop cold or clammy skin.  You continue to vomit for longer than 24 to 48 hours.  You have a fever. MAKE SURE YOU:   Understand these instructions.  Will watch your condition.  Will get help right away if you are not doing well or get worse. Document Released: 09/27/2005 Document Revised: 12/20/2011 Document Reviewed: 02/24/2011 [facility] Patient Information 2015 [place], [place]. This information is not  intended to replace advice given to you by your health care provider. Make sure you discuss any questions you have with your health care provider.   Trichomoniasis Trichomoniasis is an infection caused by an organism called Trichomonas. The infection can affect both women and men. In women, the outer female genitalia and the vagina are affected. In men, the penis is mainly affected, but the prostate and other reproductive organs can also be involved. Trichomoniasis is a sexually transmitted infection (STI) and is most often passed to another person through sexual contact.  RISK FACTORS  Having unprotected sexual intercourse.  Having sexual intercourse with an infected partner. SIGNS AND SYMPTOMS  Symptoms of trichomoniasis in women include:  Abnormal gray-green frothy vaginal discharge.  Itching and irritation of the vagina.  Itching and irritation of the area outside the vagina. Symptoms of trichomoniasis in men include:   Penile discharge with  or without pain.  Pain during urination. This results from inflammation of the urethra. DIAGNOSIS  Trichomoniasis may be found during a Pap test or physical exam. Your health care provider may use one of the following methods to help diagnose this infection:  Examining vaginal discharge under a microscope. For men, urethral discharge would be examined.  Testing the pH of the vagina with a test tape.  Using a vaginal swab test that checks for the Trichomonas organism. A test is available that provides results within a few minutes.  Doing a culture test for the organism. This is not usually needed. TREATMENT   You may be given medicine to fight the infection. Women should inform their health care provider if they could be or are pregnant. Some medicines used to treat the infection should not be taken during pregnancy.  Your health care provider may recommend over-the-counter medicines or creams to decrease itching or irritation.  Your sexual partner will need to be treated if infected. HOME CARE INSTRUCTIONS   Take medicines only as directed by your health care provider.  Take over-the-counter medicine for itching or irritation as directed by your health care provider.  Do not have sexual intercourse while you have the infection.  Women should not douche or wear tampons while they have the infection.  Discuss your infection with your partner. Your partner may have gotten the infection from you, or you may have gotten it from your partner.  Have your sex partner get examined and treated if necessary.  Practice safe, informed, and protected sex.  See your health care provider for other STI testing. SEEK MEDICAL CARE IF:   You still have symptoms after you finish your medicine.  You develop abdominal pain.  You have pain when you urinate.  You have bleeding after sexual intercourse.  You develop a rash.  Your medicine makes you sick or makes you throw up (vomit). MAKE  SURE YOU:  Understand these instructions.  Will watch your condition.  Will get help right away if you are not doing well or get worse. Document Released: 03/23/2001 Document Revised: 02/11/2014 Document Reviewed: 07/09/2013 [facility] Patient Information 2015 [place], [place]. This information is not intended to replace advice given to you by your health care provider. Make sure you discuss any questions you have with your health care provider.

## 2015-05-11 NOTE — Progress Notes (Signed)
29 y.o. F seen in the ED for abdominal pain, EDP asked CM to see the pt because she is unable to afford her copay even though she has Insurance through [employer]. CM spoke with pt who confirms self pay [facility] resident with no pcp.  CM discussed and provided written information for self pay pcps, discussed the importance of pcp vs EDP services for f/u care, www.needymeds.org, www.goodrx.com, discounted pharmacies and other [employer] such as [facility] [facility] , [place],   [place] med assist, financial assistance, self pay dental services, [place] med assist, DSS and  health department  Reviewed resources for [employer] self pay pcps like [facility], family medicine at [facility], [facility], [facility], local urgent care centers, [facility], [facility] family practice, general medical clinics, family services of the [place], [facility] urgent care plus others, medication resources, [facility] out patient pharmacies and housing Pt voiced understanding and appreciation of resources provided   Provided [facility] contact information Pt agreed to a referral Cm completed referral Pt to be contact by [facility] clinical liaison

## 2015-05-11 NOTE — ED Notes (Signed)
Pt also reporting vaginal itching and yellow discharge with odor. Pt states that she is prone to BV and denies sexual activity.

## 2015-05-12 LAB — GC/CHLAMYDIA PROBE AMP (~~LOC~~) NOT AT ARMC
Chlamydia: NEGATIVE
Neisseria Gonorrhea: NEGATIVE

## 2015-07-29 ENCOUNTER — Encounter (HOSPITAL_COMMUNITY): Payer: Self-pay | Admitting: Emergency Medicine

## 2015-07-29 ENCOUNTER — Emergency Department (HOSPITAL_COMMUNITY)
Admission: EM | Admit: 2015-07-29 | Discharge: 2015-07-29 | Disposition: A | Discharge status: Home / Self Care | Payer: No Typology Code available for payment source | Attending: Emergency Medicine | Admitting: Emergency Medicine

## 2015-07-29 DIAGNOSIS — Z8719 Personal history of other diseases of the digestive system: Secondary | ICD-10-CM | POA: Diagnosis not present

## 2015-07-29 DIAGNOSIS — Z79899 Other long term (current) drug therapy: Secondary | ICD-10-CM | POA: Insufficient documentation

## 2015-07-29 DIAGNOSIS — Z87442 Personal history of urinary calculi: Secondary | ICD-10-CM | POA: Diagnosis not present

## 2015-07-29 DIAGNOSIS — M545 Low back pain, unspecified: Secondary | ICD-10-CM

## 2015-07-29 DIAGNOSIS — R531 Weakness: Secondary | ICD-10-CM | POA: Insufficient documentation

## 2015-07-29 DIAGNOSIS — J45909 Unspecified asthma, uncomplicated: Secondary | ICD-10-CM | POA: Insufficient documentation

## 2015-07-29 DIAGNOSIS — R202 Paresthesia of skin: Secondary | ICD-10-CM | POA: Insufficient documentation

## 2015-07-29 DIAGNOSIS — M79604 Pain in right leg: Secondary | ICD-10-CM | POA: Diagnosis present

## 2015-07-29 DIAGNOSIS — Z8619 Personal history of other infectious and parasitic diseases: Secondary | ICD-10-CM | POA: Insufficient documentation

## 2015-07-29 DIAGNOSIS — Z8679 Personal history of other diseases of the circulatory system: Secondary | ICD-10-CM | POA: Insufficient documentation

## 2015-07-29 DIAGNOSIS — M792 Neuralgia and neuritis, unspecified: Secondary | ICD-10-CM | POA: Insufficient documentation

## 2015-07-29 NOTE — ED Notes (Signed)
C/o "burning pain and numbness" to right leg and foot. States has had this problem for "a while". Pain worse with prolonged sitting or prolonged stranding. [facility].

## 2015-07-29 NOTE — ED Provider Notes (Signed)
CSN: [id number]     Arrival date & time 07/29/15  1046 History  By signing my name below, I, Elon Spanner, attest that this documentation has been prepared under the direction and in the presence of Wendel Homeyer Camprubi-Soms, PA-C. Electronically Signed: Elon Spanner ED Scribe. 07/29/2015. 11:38 AM.   Chief Complaint  Patient presents with  . Leg Pain   Patient is a 29 y.o. female presenting with leg pain. The history is provided by the patient. No language interpreter was used.  Leg Pain Location:  Leg Time since incident: 14 years. Injury: no   Leg location:  R leg Pain details:    Quality:  Shooting   Radiates to:  R leg   Severity:  Moderate   Onset quality:  Gradual   Duration: 14 yrs.   Timing:  Constant   Progression:  Unchanged Chronicity:  Chronic Prior injury to area:  No Relieved by:  NSAIDs Worsened by:  Activity Ineffective treatments:  None tried Associated symptoms: numbness and tingling   Associated symptoms: no back pain, no decreased ROM, no fever, no muscle weakness and no swelling    HPI Comments: Linda Chen is a 29 y.o. female who presents to the Emergency Department complaining of constant, 8/10 shooting numbness/tingling in the right buttocks down throughout the entirety of the right leg onset 14 years ago without new injury; worse with prolonged sitting/standing, and improved somewhat with aleve.  Associated symptoms include mild right leg weakness due to the pain.  She decided to come to the ED today at the urging of her employer after she had to rest while working yesterday.  She denies back pain, headache, vision changes, fever, chills, CP, SOB, n/v/c/d, dysuria, hematuria, abdominal pain, incontinence of urine/stool, saddle paresthesias, or other symptoms. She states that her symptoms have been ongoing since she had knee surgery in 2001.  Past Medical History  Diagnosis Date  . Asthma   . Dysrhythmia, cardiac   . Kidney stone   . Chlamydia     Pt  reports treated 12/2012; no further contact with that partner  . Cholecystitis    Past Surgical History  Procedure Laterality Date  . Knee surgery     Family History  Problem Relation Age of Onset  . Stroke Mother   . Gout Father    Social History  Substance Use Topics  . Smoking status: Never Smoker   . Smokeless tobacco: Never Used  . Alcohol Use: No     Comment: social   OB History    Gravida Para Term Preterm AB TAB SAB Ectopic Multiple Living   Review of Systems  Constitutional: Negative for fever and chills.  Eyes: Negative for visual disturbance.  Respiratory: Negative for shortness of breath.   Cardiovascular: Negative for chest pain.  Gastrointestinal: Negative for nausea, vomiting, diarrhea and constipation.  Genitourinary: Negative for dysuria, hematuria and difficulty urinating (no incontinence).  Musculoskeletal: Negative for myalgias, back pain and arthralgias.  Skin: Negative for color change.  Allergic/Immunologic: Negative for immunocompromised state.  Neurological: Positive for weakness and numbness. Negative for headaches.  Psychiatric/Behavioral: Negative for confusion.   10 Systems reviewed and all are negative for acute change except as noted in the HPI.   Allergies  Review of patient's allergies indicates no known allergies.  Home Medications   Prior to Admission medications   Medication Sig Start Date End Date Taking? Authorizing Provider  bismuth subsalicylate (PEPTO BISMOL) 262 MG/15ML suspension Take 30 mLs by mouth every 6 (six) hours as needed for indigestion (upset stomach).    Historical Provider, MD  Etonogestrel (IMPLANON [place]) Inject 1 each into the skin once. Placed in 2011.    Historical Provider, MD  HYDROcodone-acetaminophen (NORCO) 5-325 MG per tablet Take 1 tablet by mouth every 6 (six) hours as needed for severe pain. 05/11/15   Pricilla Loveless, MD  ibuprofen (ADVIL,MOTRIN) 200 MG tablet Take 800 mg by mouth every  6 (six) hours as needed for headache or moderate pain.    Historical Provider, MD  LEVAQUIN 500 MG tablet Take 1 tablet (500 mg total) by mouth daily. Patient not taking: Reported on 04/16/2015 02/18/15   Rhetta Mura, MD  meloxicam (MOBIC) 7.5 MG tablet Take 1 tablet (7.5 mg total) by mouth 2 (two) times daily after a meal. 04/16/15   Linna Hoff, MD  omeprazole (PRILOSEC OTC) 20 MG tablet Take 20 mg by mouth daily as needed (acid reflux).    Historical Provider, MD  ondansetron (ZOFRAN ODT) 4 MG disintegrating tablet  ODT q4 hours prn nausea/vomit 05/11/15   Pricilla Loveless, MD  tamsulosin (FLOMAX) 0.4 MG CAPS capsule Take 1 capsule (0.4 mg total) by mouth daily after supper. 02/18/15   Rhetta Mura, MD  traMADol (ULTRAM) 50 MG tablet Take 2 tablets (100 mg total) by mouth every 6 (six) hours as needed for moderate pain. 02/18/15   Rhetta Mura, MD   BP 131/62 mmHg  Pulse 70  Temp(Src) 98.5 F (36.9 C) (Oral)  Resp 18  SpO2 100%  LMP 06/28/2015 Physical Exam  Constitutional: She is oriented to person, place, and time. Vital signs are normal. She appears well-developed and well-nourished.  Non-toxic appearance. No distress.  Afebrile, nontoxic, NAD  HENT:  Head: Normocephalic and atraumatic.  Mouth/Throat: Mucous membranes are normal.  Eyes: Conjunctivae and EOM are normal. Right eye exhibits no discharge. Left eye exhibits no discharge.  Neck: Normal range of motion. Neck supple.  Cardiovascular: Normal rate and intact distal pulses.   Pulmonary/Chest: Effort normal. No respiratory distress.  Abdominal: Normal appearance. She exhibits no distension.  Musculoskeletal: Normal range of motion.       Lumbar back: She exhibits tenderness. She exhibits normal range of motion, no bony tenderness, no deformity and no spasm.  lumbar spine with FROM intact without spinous process TTP, no bony stepoffs or deformities, with mild bilateral paraspinous muscle TTP without muscle  spasms.  Tenderness extending into right gluteus/piriformis muscles. Strength 5/5 in all extremities, sensation grossly intact in all extremities, negative SLR bilaterally, gait steady and nonantalgic. No overlying skin changes.   Neurological: She is alert and oriented to person, place, and time. She has normal strength. No sensory deficit.  Skin: Skin is warm, dry and intact. No rash noted.  Psychiatric: She has a normal mood and affect. Her behavior is normal.  Nursing note and vitals reviewed.   ED Course  Procedures (including critical care time)  DIAGNOSTIC STUDIES: Oxygen Saturation is 100% on RA, normal by my interpretation.    COORDINATION OF CARE:  11:20 AM Will refer to orthopaedist.  Patient should use heat, Tylenol/ibuprofen.  Patient acknowledges and agrees with plan.    Labs Review Labs Reviewed - No data to display  Imaging Review No results found.    EKG Interpretation None      MDM   Final diagnoses:  Neuropathic pain of lower extremity, right  Bilateral low back pain  without sciatica  Paresthesia    29 y.o. female here with 6078yrs of neuropathic type pain in R leg, no dermatomal distribution, NVI with soft compartments. Mild low back tenderness, no midline tenderness. No red flag s/s of low back pain. No s/s of central cord compression or cauda equina. Lower extremities are neurovascularly intact and patient is ambulating without difficulty. Discussed that due to the chronic nature of complaint, difficult to determine what exact cause is, but could benefit from EMG/NCS, doubt need for emergent imaging right now but could potentially benefit from imaging after work up at orthopedists. Discussed heat therapy and tylenol/motrin and f/up with ortho in 1-2wks.  Patient was counseled on back pain precautions and told to do activity as tolerated but do not lift, push, or pull heavy objects more than 10 pounds for the next week. Patient counseled to use ice or heat  on back for no longer than 15 minutes every hour.   Patient urged to follow-up with orthopedist if pain does not improve with treatment and rest or if pain becomes recurrent. Urged to return with worsening severe pain, loss of bowel or bladder control, trouble walking. The patient verbalizes understanding and agrees with the plan.   I personally performed the services described in this documentation, which was scribed in my presence. The recorded information has been reviewed and is accurate.  BP 131/62 mmHg  Pulse 70  Temp(Src) 98.5 F (36.9 C) (Oral)  Resp 18  SpO2 100%  LMP 06/28/2015  No orders of the defined types were placed in this encounter.      France RavensMercedes Camprubi-Soms, PA-C 07/29/15 1157  Leta BaptistEmily Roe Nguyen, MD 07/29/15 2138

## 2015-07-29 NOTE — Discharge Instructions (Signed)
Back Pain:  Your back pain should be treated with medicines such as ibuprofen or aleve and this back pain should get better over the next 2 weeks.  However if you develop severe or worsening pain, low back pain with fever, numbness, weakness or inability to walk or urinate, you should return to the ER immediately.  Please follow up with your doctor this week for a recheck if still having symptoms.  Low back pain is discomfort in the lower back that may be due to injuries to muscles and ligaments around the spine.  Occasionally, it may be caused by a a problem to a part of the spine called a disc.  The pain may last several days or a week;  However, most patients get completely well in 4 weeks.  Self - care:  The application of heat can help soothe the pain.  Maintaining your daily activities, including walking, is encourged, as it will help you get better faster than just staying in bed. Perform gentle stretching as discussed. Drink plenty of fluids.  Medications are also useful to help with pain control.  A commonly prescribed medication includes tylenol.  Non steroidal anti inflammatory medications including Ibuprofen and naproxen;  These medications help both pain and swelling and are very useful in treating back pain.  They should be taken with food, as they can cause stomach upset, and more seriously, stomach bleeding.     SEEK IMMEDIATE MEDICAL ATTENTION IF: New numbness, tingling, weakness, or problem with the use of your arms or legs.  Severe back pain not relieved with medications.  Difficulty with or loss of control of your bowel or bladder control.  Increasing pain in any areas of the body (such as chest or abdominal pain).  Shortness of breath, dizziness or fainting.  Nausea (feeling sick to your stomach), vomiting, fever, or sweats.  You will need to follow up with the orthopedist in 1-2 weeks for reassessment.   Back Pain, Adult Back pain is very common in adults.The cause of  back pain is rarely dangerous and the pain often gets better over time.The cause of your back pain may not be known. Some common causes of back pain include:  Strain of the muscles or ligaments supporting the spine.  Wear and tear (degeneration) of the spinal disks.  Arthritis.  Direct injury to the back. For many people, back pain may return. Since back pain is rarely dangerous, most people can learn to manage this condition on their own. HOME CARE INSTRUCTIONS Watch your back pain for any changes. The following actions may help to lessen any discomfort you are feeling:  Remain active. It is stressful on your back to sit or stand in one place for long periods of time. Do not sit, drive, or stand in one place for more than 30 minutes at a time. Take short walks on even surfaces as soon as you are able.Try to increase the length of time you walk each day.  Exercise regularly as directed by your health care provider. Exercise helps your back heal faster. It also helps avoid future injury by keeping your muscles strong and flexible.  Do not stay in bed.Resting more than 1-2 days can delay your recovery.  Pay attention to your body when you bend and lift. The most comfortable positions are those that put less stress on your recovering back. Always use proper lifting techniques, including:  Bending your knees.  Keeping the load close to your body.  Avoiding twisting.  Find a comfortable position to sleep. Use a firm mattress and lie on your side with your knees slightly bent. If you lie on your back, put a pillow under your knees.  Avoid feeling anxious or stressed.Stress increases muscle tension and can worsen back pain.It is important to recognize when you are anxious or stressed and learn ways to manage it, such as with exercise.  Take medicines only as directed by your health care provider. Over-the-counter medicines to reduce pain and inflammation are often the most helpful.Your  health care provider may prescribe muscle relaxant drugs.These medicines help dull your pain so you can more quickly return to your normal activities and healthy exercise.  Apply ice to the injured area:  Put ice in a plastic bag.  Place a towel between your skin and the bag.  Leave the ice on for 20 minutes, 2-3 times a day for the first 2-3 days. After that, ice and heat may be alternated to reduce pain and spasms.  Maintain a healthy weight. Excess weight puts extra stress on your back and makes it difficult to maintain good posture. SEEK MEDICAL CARE IF:  You have pain that is not relieved with rest or medicine.  You have increasing pain going down into the legs or buttocks.  You have pain that does not improve in one week.  You have night pain.  You lose weight.  You have a fever or chills. SEEK IMMEDIATE MEDICAL CARE IF:   You develop new bowel or bladder control problems.  You have unusual weakness or numbness in your arms or legs.  You develop nausea or vomiting.  You develop abdominal pain.  You feel faint.   This information is not intended to replace advice given to you by your health care provider. Make sure you discuss any questions you have with your health care provider.   Document Released: 09/27/2005 Document Revised: 10/18/2014 Document Reviewed: 01/29/2014 Elsevier Interactive Patient Education 2016 [place] Injury Prevention Back injuries can be very painful. They can also be difficult to heal. After having one back injury, you are more likely to injure your back again. It is important to learn how to avoid injuring or re-injuring your back. The following tips can help you to prevent a back injury. WHAT SHOULD I KNOW ABOUT PHYSICAL FITNESS?  Exercise for 30 minutes per day on most days of the week or as told by your doctor. Make sure to:  Do aerobic exercises, such as walking, jogging, biking, or swimming.  Do exercises that increase  balance and strength, such as tai chi and yoga.  Do stretching exercises. This helps with flexibility.  Try to develop strong belly (abdominal) muscles. Your belly muscles help to support your back.  Stay at a healthy weight. This helps to decrease your risk of a back injury. WHAT SHOULD I KNOW ABOUT MY DIET?  Talk with your doctor about your overall diet. Take supplements and vitamins only as told by your doctor.  Talk with your doctor about how much calcium and vitamin D you need each day. These nutrients help to prevent weakening of the bones (osteoporosis).  Include good sources of calcium in your diet, such as:  Dairy products.  Green leafy vegetables.  Products that have had calcium added to them (fortified).  Include good sources of vitamin D in your diet, such as:  Milk.  Foods that have had vitamin D added to them. WHAT SHOULD I KNOW ABOUT MY POSTURE?  Sit  up straight and stand up straight. Avoid leaning forward when you sit or hunching over when you stand.  Choose chairs that have good low-back (lumbar) support.  If you work at a desk, sit close to it so you do not need to lean over. Keep your chin tucked in. Keep your neck drawn back. Keep your elbows bent so your arms look like the letter "L" (right angle).  Sit high and close to the steering wheel when you drive. Add a low-back support to your car seat, if needed.  Avoid sitting or standing in one position for very long. Take breaks to get up, stretch, and walk around at least one time every hour. Take breaks every hour if you are driving for long periods of time.  Sleep on your side with your knees slightly bent, or sleep on your back with a pillow under your knees. Do not lie on the front of your body to sleep. WHAT SHOULD I KNOW ABOUT LIFTING, TWISTING, AND REACHING Lifting and Heavy Lifting  Avoid heavy lifting, especially lifting over and over again. If you must do heavy lifting:  Stretch before  lifting.  Work slowly.  Rest between lifts.  Use a tool such as a cart or a dolly to move objects if one is available.  Make several small trips instead of carrying one heavy load.  Ask for help when you need it, especially when moving big objects.  Follow these steps when lifting:  Stand with your feet shoulder-width apart.  Get as close to the object as you can. Do not pick up a heavy object that is far from your body.  Use handles or lifting straps if they are available.  Bend at your knees. Squat down, but keep your heels off the floor.  Keep your shoulders back. Keep your chin tucked in. Keep your back straight.  Lift the object slowly while you tighten the muscles in your legs, belly, and butt. Keep the object as close to the center of your body as possible.  Follow these steps when putting down a heavy load:  Stand with your feet shoulder-width apart.  Lower the object slowly while you tighten the muscles in your legs, belly, and butt. Keep the object as close to the center of your body as possible.  Keep your shoulders back. Keep your chin tucked in. Keep your back straight.  Bend at your knees. Squat down, but keep your heels off the floor.  Use handles or lifting straps if they are available. Twisting and Reaching 1. Avoid lifting heavy objects above your waist. 2. Do not twist at your waist while you are lifting or carrying a load. If you need to turn, move your feet. 3. Do not bend over without bending at your knees. 4. Avoid reaching over your head, across a table, or for an object on a high surface.  WHAT ARE SOME OTHER TIPS? 1. Avoid wet floors and icy ground. Keep sidewalks clear of ice to prevent falls.  2. Do not sleep on a mattress that is too soft or too hard.  3. Keep items that you use often within easy reach.  4. Put heavier objects on shelves at waist level, and put lighter objects on lower or higher shelves. 5. Find ways to lower your  stress, such as: 1. Exercise. 2. Massage. 3. Relaxation techniques. 6. Talk with your doctor if you feel anxious or depressed. These conditions can make back pain worse. 7. Wear flat heel shoes  with cushioned soles. 8. Avoid making quick (sudden) movements. 9. Use both shoulder straps when carrying a backpack. 10. Do not use any tobacco products, including cigarettes, chewing tobacco, or electronic cigarettes. If you need help quitting, ask your doctor.   This information is not intended to replace advice given to you by your health care provider. Make sure you discuss any questions you have with your health care provider.   Document Released: 03/15/2008 Document Revised: 02/11/2015 Document Reviewed: 10/01/2014 Elsevier Interactive Patient Education 2016 [place].  Back Exercises If you have pain in your back, do these exercises 2-3 times each day or as told by your doctor. When the pain goes away, do the exercises once each day, but repeat the steps more times for each exercise (do more repetitions). If you do not have pain in your back, do these exercises once each day or as told by your doctor. EXERCISES Single Knee to Chest Do these steps 3-5 times in a row for each leg:  Lie on your back on a firm bed or the floor with your legs stretched out.  Bring one knee to your chest.  Hold your knee to your chest by grabbing your knee or thigh.  Pull on your knee until you feel a gentle stretch in your lower back.  Keep doing the stretch for 10-30 seconds.  Slowly let go of your leg and straighten it. Pelvic Tilt Do these steps 5-10 times in a row:  Lie on your back on a firm bed or the floor with your legs stretched out.  Bend your knees so they point up to the ceiling. Your feet should be flat on the floor.  Tighten your lower belly (abdomen) muscles to press your lower back against the floor. This will make your tailbone point up to the ceiling instead of pointing down to  your feet or the floor.  Stay in this position for 5-10 seconds while you gently tighten your muscles and breathe evenly. Cat-Cow Do these steps until your lower back bends more easily:  Get on your hands and knees on a firm surface. Keep your hands under your shoulders, and keep your knees under your hips. You may put padding under your knees.  Let your head hang down, and make your tailbone point down to the floor so your lower back is round like the back of a cat.  Stay in this position for 5 seconds.  Slowly lift your head and make your tailbone point up to the ceiling so your back hangs low (sags) like the back of a cow.  Stay in this position for 5 seconds. Press-Ups Do these steps 5-10 times in a row:  Lie on your belly (face-down) on the floor.  Place your hands near your head, about shoulder-width apart.  While you keep your back relaxed and keep your hips on the floor, slowly straighten your arms to raise the top half of your body and lift your shoulders. Do not use your back muscles. To make yourself more comfortable, you may change where you place your hands.  Stay in this position for 5 seconds.  Slowly return to lying flat on the floor. Bridges Do these steps 10 times in a row: 5. Lie on your back on a firm surface. 6. Bend your knees so they point up to the ceiling. Your feet should be flat on the floor. 7. Tighten your butt muscles and lift your butt off of the floor until your waist is  almost as high as your knees. If you do not feel the muscles working in your butt and the back of your thighs, slide your feet 1-2 inches farther away from your butt. 8. Stay in this position for 3-5 seconds. 9. Slowly lower your butt to the floor, and let your butt muscles relax. If this exercise is too easy, try doing it with your arms crossed over your chest. Belly Crunches Do these steps 5-10 times in a row: 11. Lie on your back on a firm bed or the floor with your legs  stretched out. 12. Bend your knees so they point up to the ceiling. Your feet should be flat on the floor. 56. Cross your arms over your chest. 14. Tip your chin a little bit toward your chest but do not bend your neck. 94. Tighten your belly muscles and slowly raise your chest just enough to lift your shoulder blades a tiny bit off of the floor. 16. Slowly lower your chest and your head to the floor. Back Lifts Do these steps 5-10 times in a row: 1. Lie on your belly (face-down) with your arms at your sides, and rest your forehead on the floor. 2. Tighten the muscles in your legs and your butt. 3. Slowly lift your chest off of the floor while you keep your hips on the floor. Keep the back of your head in line with the curve in your back. Look at the floor while you do this. 4. Stay in this position for 3-5 seconds. 5. Slowly lower your chest and your face to the floor. GET HELP IF:  Your back pain gets a lot worse when you do an exercise.  Your back pain does not lessen 2 hours after you exercise. If you have any of these problems, stop doing the exercises. Do not do them again unless your doctor says it is okay. GET HELP RIGHT AWAY IF:  You have sudden, very bad back pain. If this happens, stop doing the exercises. Do not do them again unless your doctor says it is okay.   This information is not intended to replace advice given to you by your health care provider. Make sure you discuss any questions you have with your health care provider.   Document Released: 10/30/2010 Document Revised: 06/18/2015 Document Reviewed: 11/21/2014 Elsevier Interactive Patient Education 2016 [place] therapy can help ease sore, stiff, injured, and tight muscles and joints. Heat relaxes your muscles, which may help ease your pain. Heat therapy should only be used on old, pre-existing, or long-lasting (chronic) injuries. Do not use heat therapy unless told by your doctor. HOW TO  USE HEAT THERAPY There are several different kinds of heat therapy, including:  Moist heat pack.  Warm water bath.  Hot water bottle.  Electric heating pad.  Heated gel pack.  Heated wrap.  Electric heating pad. GENERAL HEAT THERAPY RECOMMENDATIONS   Do not sleep while using heat therapy. Only use heat therapy while you are awake.  Your skin may turn pink while using heat therapy. Do not use heat therapy if your skin turns red.  Do not use heat therapy if you have new pain.  High heat or long exposure to heat can cause burns. Be careful when using heat therapy to avoid burning your skin.  Do not use heat therapy on areas of your skin that are already irritated, such as with a rash or sunburn. GET HELP IF:   You have blisters,  redness, swelling (puffiness), or numbness.  You have new pain.  Your pain is worse. MAKE SURE YOU:  Understand these instructions.  Will watch your condition.  Will get help right away if you are not doing well or get worse.   This information is not intended to replace advice given to you by your health care provider. Make sure you discuss any questions you have with your health care provider.   Document Released: 12/20/2011 Document Revised: 10/18/2014 Document Reviewed: 11/20/2013 Elsevier Interactive Patient Education 2016 [employer]  Paresthesia Paresthesia is an abnormal burning or prickling sensation. This sensation is generally felt in the hands, arms, legs, or feet. However, it may occur in any part of the body. Usually, it is not painful. The feeling may be described as:  Tingling or numbness.  Pins and needles.  Skin crawling.  Buzzing.  Limbs falling asleep.  Itching. Most people experience temporary (transient) paresthesia at some time in their lives. Paresthesia may occur when you breathe too quickly (hyperventilation). It can also occur without any apparent cause. Commonly, paresthesia occurs when pressure is  placed on a nerve. The sensation quickly goes away after the pressure is removed. For some people, however, paresthesia is a long-lasting (chronic) condition that is caused by an underlying disorder. If you continue to have paresthesia, you may need further medical evaluation. HOME CARE INSTRUCTIONS Watch your condition for any changes. Taking the following actions may help to lessen any discomfort that you are feeling:  Avoid drinking alcohol.  Try acupuncture or massage to help relieve your symptoms.  Keep all follow-up visits as directed by your health care provider. This is important. SEEK MEDICAL CARE IF:  You continue to have episodes of paresthesia.  Your burning or prickling feeling gets worse when you walk.  You have pain, cramps, or dizziness.  You develop a rash. SEEK IMMEDIATE MEDICAL CARE IF:  You feel weak.  You have trouble walking or moving.  You have problems with speech, understanding, or vision.  You feel confused.  You cannot control your bladder or bowel movements.  You have numbness after an injury.  You faint.   This information is not intended to replace advice given to you by your health care provider. Make sure you discuss any questions you have with your health care provider.   Document Released: 09/17/2002 Document Revised: 02/11/2015 Document Reviewed: 09/23/2014 Elsevier Interactive Patient Education 2016 [employer]   Radicular Pain Radicular pain in either the arm or leg is usually from a bulging or herniated disk in the spine. A piece of the herniated disk may press against the nerves as the nerves exit the spine. This causes pain which is felt at the tips of the nerves down the arm or leg. Other causes of radicular pain may include:  Fractures.  Heart disease.  Cancer.  An abnormal and usually degenerative state of the nervous system or nerves (neuropathy). Diagnosis may require CT or MRI scanning to determine the primary cause.   Nerves that start at the neck (nerve roots) may cause radicular pain in the outer shoulder and arm. It can spread down to the thumb and fingers. The symptoms vary depending on which nerve root has been affected. In most cases radicular pain improves with conservative treatment. Neck problems may require physical therapy, a neck collar, or cervical traction. Treatment may take many weeks, and surgery may be considered if the symptoms do not improve.  Conservative treatment is also recommended for sciatica. Sciatica causes  pain to radiate from the lower back or buttock area down the leg into the foot. Often there is a history of back problems. Most patients with sciatica are better after 2 to 4 weeks of rest and other supportive care. Short term bed rest can reduce the disk pressure considerably. Sitting, however, is not a good position since this increases the pressure on the disk. You should avoid bending, lifting, and all other activities which make the problem worse. Traction can be used in severe cases. Surgery is usually reserved for patients who do not improve within the first months of treatment. Only take over-the-counter or prescription medicines for pain, discomfort, or fever as directed by your caregiver. Narcotics and muscle relaxants may help by relieving more severe pain and spasm and by providing mild sedation. Cold or massage can give significant relief. Spinal manipulation is not recommended. It can increase the degree of disc protrusion. Epidural steroid injections are often effective treatment for radicular pain. These injections deliver medicine to the spinal nerve in the space between the protective covering of the spinal cord and back bones (vertebrae). Your caregiver can give you more information about steroid injections. These injections are most effective when given within two weeks of the onset of pain.  You should see your caregiver for follow up care as recommended. A program for neck  and back injury rehabilitation with stretching and strengthening exercises is an important part of management.  SEEK IMMEDIATE MEDICAL CARE IF:  You develop increased pain, weakness, or numbness in your arm or leg.  You develop difficulty with bladder or bowel control.  You develop abdominal pain.   This information is not intended to replace advice given to you by your health care provider. Make sure you discuss any questions you have with your health care provider.   Document Released: 11/04/2004 Document Revised: 10/18/2014 Document Reviewed: 04/23/2015 Elsevier Interactive Patient Education [employer].

## 2015-10-01 ENCOUNTER — Encounter (HOSPITAL_COMMUNITY): Payer: Self-pay | Admitting: *Deleted

## 2015-10-01 ENCOUNTER — Inpatient Hospital Stay (HOSPITAL_COMMUNITY)
Admission: AD | Admit: 2015-10-01 | Discharge: 2015-10-01 | Disposition: A | Discharge status: Home / Self Care | Payer: No Typology Code available for payment source | Source: Ambulatory Visit | Attending: Obstetrics and Gynecology | Admitting: Obstetrics and Gynecology

## 2015-10-01 DIAGNOSIS — N898 Other specified noninflammatory disorders of vagina: Secondary | ICD-10-CM | POA: Diagnosis present

## 2015-10-01 DIAGNOSIS — N926 Irregular menstruation, unspecified: Secondary | ICD-10-CM | POA: Insufficient documentation

## 2015-10-01 DIAGNOSIS — N9089 Other specified noninflammatory disorders of vulva and perineum: Secondary | ICD-10-CM | POA: Diagnosis not present

## 2015-10-01 LAB — URINALYSIS, ROUTINE W REFLEX MICROSCOPIC
Bilirubin Urine: NEGATIVE
Glucose, UA: NEGATIVE mg/dL
Ketones, ur: NEGATIVE mg/dL
Nitrite: NEGATIVE
Protein, ur: NEGATIVE mg/dL
Specific Gravity, Urine: >1.03 — ABNORMAL HIGH (ref 1.005–1.030)
pH: 6 (ref 5.0–8.0)

## 2015-10-01 LAB — WET PREP, GENITAL
Sperm: NONE SEEN
Trich, Wet Prep: NONE SEEN
Yeast Wet Prep HPF POC: NONE SEEN

## 2015-10-01 LAB — URINE MICROSCOPIC-ADD ON

## 2015-10-01 LAB — POCT PREGNANCY, URINE: Preg Test, Ur: NEGATIVE

## 2015-10-01 NOTE — Discharge Instructions (Signed)

## 2015-10-01 NOTE — MAU Provider Note (Signed)
History     CSN: [id number]  Arrival date and time: 10/01/15 [id number]   First Provider Initiated Contact with Patient 10/01/15 1946      No chief complaint on file.  HPI Linda Chen 29 y.o. [phone] nonpregnant female presents with menstrual irregularities and vaginal irritation.  She denies vaginal discharge but is having irritation and itching.  She used antifungal powder on her inner thighs daily for a week and feels this has made her symptoms worse.  The itching goes all the way to her backside.  She has not had a period in a few months.  She has Nexplanon in place since 2012.  She denies weakness, fever, vaginal bleeding, discharge, dysuria.  She has changed her soap recently.   OB History    Gravida Para Term Preterm AB TAB SAB Ectopic Multiple Living   2 2 2       2       Past Medical History  Diagnosis Date  . Asthma   . Dysrhythmia, cardiac   . Kidney stone   . Chlamydia     Pt reports treated 12/2012; no further contact with that partner  . Cholecystitis     Past Surgical History  Procedure Laterality Date  . Knee surgery      Family History  Problem Relation Age of Onset  . Stroke Mother   . Gout Father     Social History  Substance Use Topics  . Smoking status: Never Smoker   . Smokeless tobacco: Never Used  . Alcohol Use: No     Comment: social    Allergies: No Known Allergies  No prescriptions prior to admission    ROS Pertinent ROS in HPI.  All other systems are negative.   Physical Exam   Blood pressure 109/59, pulse 81, temperature 97.4 F (36.3 C), temperature source Oral, resp. rate 16, height 6' (1.829 m), weight 151.048 kg (333 lb), last menstrual period 07/06/2015, SpO2 98 %.  Physical Exam  Constitutional: She is oriented to person, place, and time. She appears well-developed and well-nourished. No distress.  HENT:  Head: Normocephalic and atraumatic.  Eyes: Conjunctivae and EOM are normal.  Neck: Normal range of motion. Neck  supple.  Cardiovascular: Normal rate, regular rhythm and normal heart sounds.   Respiratory: Effort normal and breath sounds normal. No respiratory distress.  GI: Soft. Bowel sounds are normal. She exhibits no distension. There is no tenderness. There is no rebound and no guarding.  Genitourinary:  No external abnormalities - no excoriation/thickening or satellite lesions.  Vagina with thin white discharge with froth present.  NO CMT.  Unable to appreciate fundus.    Musculoskeletal: Normal range of motion.  Neurological: She is alert and oriented to person, place, and time.  Skin: Skin is warm and dry.  Psychiatric: She has a normal mood and affect. Her behavior is normal.   Results for orders placed or performed during the hospital encounter of 10/01/15 (from the past 24 hour(s))  Urinalysis, Routine w reflex microscopic (not [facility])     Status: Abnormal   Collection Time: 10/01/15  7:29 PM  Result Value Ref Range   Color, Urine YELLOW YELLOW   APPearance CLEAR CLEAR   Specific Gravity, Urine >1.030 (H) 1.005 - 1.030   pH 6.0 5.0 - 8.0   Glucose, UA NEGATIVE NEGATIVE mg/dL   Hgb urine dipstick MODERATE (A) NEGATIVE   Bilirubin Urine NEGATIVE NEGATIVE   Ketones, ur NEGATIVE NEGATIVE mg/dL   Protein,  ur NEGATIVE NEGATIVE mg/dL   Nitrite NEGATIVE NEGATIVE   Leukocytes, UA TRACE (A) NEGATIVE  Urine microscopic-add on     Status: Abnormal   Collection Time: 10/01/15  7:29 PM  Result Value Ref Range   Squamous Epithelial / LPF 0-5 (A) NONE SEEN   WBC, UA 0-5 0 - 5 WBC/hpf   RBC / HPF 6-30 0 - 5 RBC/hpf   Bacteria, UA FEW (A) NONE SEEN  Pregnancy, urine POC     Status: None   Collection Time: 10/01/15  7:37 PM  Result Value Ref Range   Preg Test, Ur NEGATIVE NEGATIVE  Wet prep, genital     Status: Abnormal   Collection Time: 10/01/15  7:56 PM  Result Value Ref Range   Yeast Wet Prep HPF POC NONE SEEN NONE SEEN   Trich, Wet Prep NONE SEEN NONE SEEN   Clue Cells Wet Prep HPF POC  PRESENT (A) NONE SEEN   WBC, Wet Prep HPF POC MANY (A) NONE SEEN   Sperm NONE SEEN      MAU Course  Procedures  MDM Wet prep ordered.   Report given and care turned over to Thressa Sheller, CNM.    Assessment and Plan   1. Vulvar irritation   2. Irregular menses    DC home Comfort measures reviewed  RX: none  Return to MAU as needed  Follow-up Information    Follow up with HARPER,CHARLES A, MD.   Specialty:  Obstetrics and Gynecology   Why:  call for an appointment to have Nexplanon removed/changed    Contact information:   [address] [place] [address] [phone]        Tawnya Crook 10/01/2015, 8:46 PM

## 2015-10-01 NOTE — MAU Note (Signed)
Pt reports she has birth control implant, has not had a period in 3 months. Also reports vaginal itching. Implant was placed in 2012.

## 2015-10-13 ENCOUNTER — Emergency Department (HOSPITAL_COMMUNITY): Payer: BLUE CROSS/BLUE SHIELD

## 2015-10-13 ENCOUNTER — Emergency Department (HOSPITAL_COMMUNITY)
Admission: EM | Admit: 2015-10-13 | Discharge: 2015-10-13 | Disposition: A | Discharge status: Home / Self Care | Payer: BLUE CROSS/BLUE SHIELD | Attending: Emergency Medicine | Admitting: Emergency Medicine

## 2015-10-13 ENCOUNTER — Encounter (HOSPITAL_COMMUNITY): Payer: Self-pay

## 2015-10-13 DIAGNOSIS — Z3202 Encounter for pregnancy test, result negative: Secondary | ICD-10-CM | POA: Insufficient documentation

## 2015-10-13 DIAGNOSIS — Z79899 Other long term (current) drug therapy: Secondary | ICD-10-CM | POA: Diagnosis not present

## 2015-10-13 DIAGNOSIS — N12 Tubulo-interstitial nephritis, not specified as acute or chronic: Secondary | ICD-10-CM | POA: Diagnosis not present

## 2015-10-13 DIAGNOSIS — J45909 Unspecified asthma, uncomplicated: Secondary | ICD-10-CM | POA: Diagnosis not present

## 2015-10-13 DIAGNOSIS — Z7952 Long term (current) use of systemic steroids: Secondary | ICD-10-CM | POA: Diagnosis not present

## 2015-10-13 DIAGNOSIS — Z8679 Personal history of other diseases of the circulatory system: Secondary | ICD-10-CM | POA: Diagnosis not present

## 2015-10-13 DIAGNOSIS — N200 Calculus of kidney: Secondary | ICD-10-CM | POA: Insufficient documentation

## 2015-10-13 DIAGNOSIS — Z8619 Personal history of other infectious and parasitic diseases: Secondary | ICD-10-CM | POA: Diagnosis not present

## 2015-10-13 DIAGNOSIS — R109 Unspecified abdominal pain: Secondary | ICD-10-CM | POA: Diagnosis present

## 2015-10-13 DIAGNOSIS — Z8719 Personal history of other diseases of the digestive system: Secondary | ICD-10-CM | POA: Diagnosis not present

## 2015-10-13 LAB — COMPREHENSIVE METABOLIC PANEL
ALT: 38 U/L (ref 14–54)
AST: 31 U/L (ref 15–41)
Albumin: 3.4 g/dL — ABNORMAL LOW (ref 3.5–5.0)
Alkaline Phosphatase: 37 U/L — ABNORMAL LOW (ref 38–126)
Anion gap: 7 (ref 5–15)
BUN: 18 mg/dL (ref 6–20)
CO2: 24 mmol/L (ref 22–32)
Calcium: 8.5 mg/dL — ABNORMAL LOW (ref 8.9–10.3)
Chloride: 110 mmol/L (ref 101–111)
Creatinine, Ser: 1.19 mg/dL — ABNORMAL HIGH (ref 0.44–1.00)
GFR calc Af Amer: >60 mL/min (ref >60)
GFR calc non Af Amer: >60 mL/min (ref >60)
Glucose, Bld: 126 mg/dL — ABNORMAL HIGH (ref 65–99)
Potassium: 4 mmol/L (ref 3.5–5.1)
Sodium: 141 mmol/L (ref 135–145)
Total Bilirubin: 0.5 mg/dL (ref 0.3–1.2)
Total Protein: 6.5 g/dL (ref 6.5–8.1)

## 2015-10-13 LAB — CBC WITH DIFFERENTIAL/PLATELET
Basophils Absolute: 0 10*3/uL (ref 0.0–0.1)
Basophils Relative: 1 %
Eosinophils Absolute: 0.4 10*3/uL (ref 0.0–0.7)
Eosinophils Relative: 7 %
HCT: 35.3 % — ABNORMAL LOW (ref 36.0–46.0)
Hemoglobin: 11.4 g/dL — ABNORMAL LOW (ref 12.0–15.0)
Lymphocytes Relative: 27 %
Lymphs Abs: 1.4 10*3/uL (ref 0.7–4.0)
MCH: 29 pg (ref 26.0–34.0)
MCHC: 32.3 g/dL (ref 30.0–36.0)
MCV: 89.8 fL (ref 78.0–100.0)
Monocytes Absolute: 0.2 10*3/uL (ref 0.1–1.0)
Monocytes Relative: 5 %
Neutro Abs: 3.2 10*3/uL (ref 1.7–7.7)
Neutrophils Relative %: 60 %
Platelets: 223 10*3/uL (ref 150–400)
RBC: 3.93 MIL/uL (ref 3.87–5.11)
RDW: 13.5 % (ref 11.5–15.5)
WBC: 5.2 10*3/uL (ref 4.0–10.5)

## 2015-10-13 LAB — URINALYSIS, ROUTINE W REFLEX MICROSCOPIC
Bilirubin Urine: NEGATIVE
Glucose, UA: NEGATIVE mg/dL
Ketones, ur: NEGATIVE mg/dL
Nitrite: NEGATIVE
Protein, ur: NEGATIVE mg/dL
Specific Gravity, Urine: 1.023 (ref 1.005–1.030)
pH: 5.5 (ref 5.0–8.0)

## 2015-10-13 LAB — URINE MICROSCOPIC-ADD ON

## 2015-10-13 LAB — LIPASE, BLOOD: Lipase: 29 U/L (ref 11–51)

## 2015-10-13 LAB — POC URINE PREG, ED: Preg Test, Ur: NEGATIVE

## 2015-10-13 MED ORDER — HYDROMORPHONE HCL 1 MG/ML IJ SOLN
1.0000 mg | Freq: Once | INTRAMUSCULAR | Status: AC
  Administered 2015-10-13: 1 mg via INTRAVENOUS
  Filled 2015-10-13: qty 1

## 2015-10-13 MED ORDER — CEPHALEXIN 500 MG PO CAPS: 500.0000 mg | ORAL_CAPSULE | Freq: Three times a day (TID) | ORAL | Status: DC

## 2015-10-13 MED ORDER — KETOROLAC TROMETHAMINE 30 MG/ML IJ SOLN
30.0000 mg | Freq: Once | INTRAMUSCULAR | Status: AC
  Administered 2015-10-13: 30 mg via INTRAVENOUS
  Filled 2015-10-13: qty 1

## 2015-10-13 MED ORDER — ONDANSETRON 4 MG PO TBDP: 4.0000 mg | ORAL_TABLET | Freq: Three times a day (TID) | ORAL | Status: DC | PRN

## 2015-10-13 MED ORDER — ONDANSETRON HCL 4 MG/2ML IJ SOLN
4.0000 mg | Freq: Once | INTRAMUSCULAR | Status: AC
  Administered 2015-10-13: 4 mg via INTRAVENOUS
  Filled 2015-10-13: qty 2

## 2015-10-13 MED ORDER — NAPROXEN 500 MG PO TABS: 500.0000 mg | ORAL_TABLET | Freq: Two times a day (BID) | ORAL | Status: DC

## 2015-10-13 MED ORDER — SODIUM CHLORIDE 0.9 % IV BOLUS (SEPSIS)
1000.0000 mL | Freq: Once | INTRAVENOUS | Status: AC
Start: 2015-10-13 — End: 2015-10-13
  Administered 2015-10-13: 1000 mL via INTRAVENOUS

## 2015-10-13 NOTE — ED Notes (Signed)
Patient transported to X-ray 

## 2015-10-13 NOTE — ED Notes (Signed)
Per EMS- patient c/o left flank pain, vomiting, and diarrhea since 0500 oday. Patient states a history of kidney stones.

## 2015-10-13 NOTE — ED Provider Notes (Signed)
CSN: [id number]     Arrival date & time 10/13/15  1191 History   First MD Initiated Contact with Patient 10/13/15 [phone]     Chief Complaint  Patient presents with  . Flank Pain  . Emesis     (Consider location/radiation/quality/duration/timing/severity/associated sxs/prior Treatment) HPI Comments: Dull pain back left side, waxes and wanes, becomes sharp Feels like last kidney stone No dysuria, no hematuria Nausea/vomited x3   Patient is a 30 y.o. female presenting with flank pain and vomiting.  Flank Pain This is a new problem. The current episode started 3 to 5 hours ago. The problem occurs constantly. Pertinent negatives include no chest pain, no abdominal pain, no headaches and no shortness of breath. Nothing aggravates the symptoms. Nothing relieves the symptoms. She has tried nothing for the symptoms. The treatment provided no relief.  Emesis Associated symptoms: no abdominal pain, no arthralgias, no diarrhea (loose stool), no headaches and no sore throat     Past Medical History  Diagnosis Date  . Asthma   . Dysrhythmia, cardiac   . Kidney stone   . Chlamydia     Pt reports treated 12/2012; no further contact with that partner  . Cholecystitis    Past Surgical History  Procedure Laterality Date  . Knee surgery     Family History  Problem Relation Age of Onset  . Stroke Mother   . Gout Father    Social History  Substance Use Topics  . Smoking status: Never Smoker   . Smokeless tobacco: Never Used  . Alcohol Use: No     Comment: social   OB History    Gravida Para Term Preterm AB TAB SAB Ectopic Multiple Living   2 2 2       2      Review of Systems  Constitutional: Negative for fever.  HENT: Negative for sore throat.   Eyes: Negative for visual disturbance.  Respiratory: Negative for cough and shortness of breath.   Cardiovascular: Negative for chest pain.  Gastrointestinal: Positive for nausea and vomiting. Negative for abdominal pain, diarrhea (loose  stool) and constipation.  Genitourinary: Positive for flank pain. Negative for vaginal bleeding, vaginal discharge and difficulty urinating.  Musculoskeletal: Positive for back pain. Negative for arthralgias and neck pain.  Skin: Negative for rash.  Neurological: Negative for syncope and headaches.      Allergies  Review of patient's allergies indicates no known allergies.  Home Medications   Prior to Admission medications   Medication Sig Start Date End Date Taking? Authorizing Provider  bisacodyl (DULCOLAX) 10 MG suppository Place 10 mg rectally daily as needed for moderate constipation.   Yes Historical Provider, MD  Etonogestrel (IMPLANON [place]) Inject 1 each into the skin once. Placed in 2011.   Yes Historical Provider, MD  ferrous sulfate 325 (65 FE) MG tablet Take 325 mg by mouth daily with breakfast.   Yes Historical Provider, MD  Triamcinolone Acetonide (TRIAMCINOLONE 0.1 % CREAM : EUCERIN) CREA Apply 1 application topically daily.   Yes Historical Provider, MD  cephALEXin (KEFLEX) 500 MG capsule Take 1 capsule (500 mg total) by mouth 3 (three) times daily. 10/13/15   Alvira Monday, MD  naproxen (NAPROSYN) 500 MG tablet Take 1 tablet (500 mg total) by mouth 2 (two) times daily. 10/13/15   Alvira Monday, MD  ondansetron (ZOFRAN ODT) 4 MG disintegrating tablet Take 1 tablet (4 mg total) by mouth every 8 (eight) hours as needed for nausea or vomiting. 10/13/15   Alvira Monday, MD  BP 110/58 mmHg  Pulse 62  Resp 18  SpO2 97%  LMP 07/06/2015 (Approximate) Physical Exam  Constitutional: She is oriented to person, place, and time. She appears well-developed and well-nourished. No distress.  HENT:  Head: Normocephalic and atraumatic.  Eyes: Conjunctivae and EOM are normal.  Neck: Normal range of motion.  Cardiovascular: Normal rate, regular rhythm, normal heart sounds and intact distal pulses.  Exam reveals no gallop and no friction rub.   No murmur heard. Pulmonary/Chest: Effort  normal and breath sounds normal. No respiratory distress. She has no wheezes. She has no rales.  Abdominal: Soft. She exhibits no distension. There is no tenderness. There is no guarding.  Musculoskeletal: She exhibits no edema or tenderness.  Neurological: She is alert and oriented to person, place, and time.  Skin: Skin is warm and dry. No rash noted. She is not diaphoretic. No erythema.  Nursing note and vitals reviewed.   ED Course  Procedures (including critical care time) Labs Review Labs Reviewed  CBC WITH DIFFERENTIAL/PLATELET - Abnormal; Notable for the following:    Hemoglobin 11.4 (*)    HCT 35.3 (*)    All other components within normal limits  COMPREHENSIVE METABOLIC PANEL - Abnormal; Notable for the following:    Glucose, Bld 126 (*)    Creatinine, Ser 1.19 (*)    Calcium 8.5 (*)    Albumin 3.4 (*)    Alkaline Phosphatase 37 (*)    All other components within normal limits  URINALYSIS, ROUTINE W REFLEX MICROSCOPIC (NOT AT [facility]) - Abnormal; Notable for the following:    APPearance TURBID (*)    Hgb urine dipstick TRACE (*)    Leukocytes, UA LARGE (*)    All other components within normal limits  URINE MICROSCOPIC-ADD ON - Abnormal; Notable for the following:    Squamous Epithelial / LPF 6-30 (*)    Bacteria, UA FEW (*)    All other components within normal limits  URINE CULTURE  LIPASE, BLOOD  POC URINE PREG, ED    Imaging Review Dg Abd 1 View  10/13/2015  CLINICAL DATA:  30 year old female with left flank pain and vomiting today. EXAM: ABDOMEN - 1 VIEW COMPARISON:  10/08/2014 CT FINDINGS: No radiopaque calculi are identified. Bowel gas pattern is unremarkable. The bony structures are within normal limits. IMPRESSION: Negative. Electronically Signed   By: Harmon Pier M.D.   On: 10/13/2015 09:37   US Renal  10/13/2015  CLINICAL DATA:  Left flank pain. EXAM: RENAL / URINARY TRACT ULTRASOUND COMPLETE COMPARISON:  05/11/2015 FINDINGS: Right Kidney: Length: 10.4 cm.  Echogenicity within normal limits. No mass or hydronephrosis visualized. Left Kidney: Length: 11.8 cm. Echogenicity within normal limits. No mass or hydronephrosis visualized. 4 mm echogenic focus in the interpolar region suggests nonobstructing stone. Bladder: Appears normal for degree of bladder distention. IMPRESSION: 4 mm nonobstructing stone left kidney. Electronically Signed   By: Kennith Center M.D.   On: 10/13/2015 10:42   I have personally reviewed and evaluated these images and lab results as part of my medical decision-making.   EKG Interpretation None      MDM   Final diagnoses:  Pyelonephritis  Nephrolithiasis, nonobstructing   29 year old female with a history of asthma, nephrolithiasis, cholelithiasis presents with concern for left sided flank pain, nausea and vomiting. Urine pregnancy is negative. Have low suspicion for pelvic etiology of pain given location and history. Renal ultrasound and x-ray are ordered which showed no hydronephrosis and nonobstructing nephrolithiasis. Given 1 L of  normal saline, Zofran, Dilaudid and Toradol for pain.  Urinalysis shows signs of UTI. Presentation most consistent with pyelonephritis. Given rx for keflex for 14 days and zofran. Patient discharged in stable condition with understanding of reasons to return.    Alvira MondayErin Kaitelyn Jamison, MD 10/13/15 [id number]

## 2015-10-13 NOTE — ED Notes (Signed)
Bed: WA06 Expected date:  Expected time:  Means of arrival:  Comments: EMS-flank pain 

## 2015-10-13 NOTE — ED Notes (Signed)
Lab delay - pt wants labs done with IV start.

## 2015-10-14 LAB — URINE CULTURE

## 2015-10-21 ENCOUNTER — Emergency Department (HOSPITAL_COMMUNITY)
Admission: EM | Admit: 2015-10-21 | Discharge: 2015-10-21 | Disposition: A | Discharge status: Home / Self Care | Payer: BLUE CROSS/BLUE SHIELD | Attending: Emergency Medicine | Admitting: Emergency Medicine

## 2015-10-21 ENCOUNTER — Encounter (HOSPITAL_COMMUNITY): Payer: Self-pay | Admitting: Emergency Medicine

## 2015-10-21 ENCOUNTER — Emergency Department (HOSPITAL_COMMUNITY): Payer: BLUE CROSS/BLUE SHIELD

## 2015-10-21 DIAGNOSIS — J45909 Unspecified asthma, uncomplicated: Secondary | ICD-10-CM | POA: Diagnosis not present

## 2015-10-21 DIAGNOSIS — R197 Diarrhea, unspecified: Secondary | ICD-10-CM | POA: Insufficient documentation

## 2015-10-21 DIAGNOSIS — Z791 Long term (current) use of non-steroidal anti-inflammatories (NSAID): Secondary | ICD-10-CM | POA: Diagnosis not present

## 2015-10-21 DIAGNOSIS — Z3202 Encounter for pregnancy test, result negative: Secondary | ICD-10-CM | POA: Insufficient documentation

## 2015-10-21 DIAGNOSIS — Z79899 Other long term (current) drug therapy: Secondary | ICD-10-CM | POA: Insufficient documentation

## 2015-10-21 DIAGNOSIS — R112 Nausea with vomiting, unspecified: Secondary | ICD-10-CM | POA: Diagnosis not present

## 2015-10-21 DIAGNOSIS — Z8719 Personal history of other diseases of the digestive system: Secondary | ICD-10-CM | POA: Diagnosis not present

## 2015-10-21 DIAGNOSIS — R1032 Left lower quadrant pain: Secondary | ICD-10-CM | POA: Diagnosis not present

## 2015-10-21 DIAGNOSIS — Z8679 Personal history of other diseases of the circulatory system: Secondary | ICD-10-CM | POA: Diagnosis not present

## 2015-10-21 DIAGNOSIS — Z7952 Long term (current) use of systemic steroids: Secondary | ICD-10-CM | POA: Diagnosis not present

## 2015-10-21 DIAGNOSIS — Z8619 Personal history of other infectious and parasitic diseases: Secondary | ICD-10-CM | POA: Diagnosis not present

## 2015-10-21 DIAGNOSIS — R109 Unspecified abdominal pain: Secondary | ICD-10-CM

## 2015-10-21 DIAGNOSIS — Z792 Long term (current) use of antibiotics: Secondary | ICD-10-CM | POA: Insufficient documentation

## 2015-10-21 DIAGNOSIS — Z87442 Personal history of urinary calculi: Secondary | ICD-10-CM | POA: Insufficient documentation

## 2015-10-21 LAB — COMPREHENSIVE METABOLIC PANEL
ALT: 52 U/L (ref 14–54)
AST: 36 U/L (ref 15–41)
Albumin: 3.6 g/dL (ref 3.5–5.0)
Alkaline Phosphatase: 52 U/L (ref 38–126)
Anion gap: 7 (ref 5–15)
BUN: 16 mg/dL (ref 6–20)
CO2: 23 mmol/L (ref 22–32)
Calcium: 9 mg/dL (ref 8.9–10.3)
Chloride: 111 mmol/L (ref 101–111)
Creatinine, Ser: 1.14 mg/dL — ABNORMAL HIGH (ref 0.44–1.00)
GFR calc Af Amer: >60 mL/min (ref >60)
GFR calc non Af Amer: >60 mL/min (ref >60)
Glucose, Bld: 100 mg/dL — ABNORMAL HIGH (ref 65–99)
Potassium: 4.3 mmol/L (ref 3.5–5.1)
Sodium: 141 mmol/L (ref 135–145)
Total Bilirubin: 0.6 mg/dL (ref 0.3–1.2)
Total Protein: 7 g/dL (ref 6.5–8.1)

## 2015-10-21 LAB — CBC
HCT: 39.6 % (ref 36.0–46.0)
Hemoglobin: 12.7 g/dL (ref 12.0–15.0)
MCH: 28.9 pg (ref 26.0–34.0)
MCHC: 32.1 g/dL (ref 30.0–36.0)
MCV: 90 fL (ref 78.0–100.0)
Platelets: 281 10*3/uL (ref 150–400)
RBC: 4.4 MIL/uL (ref 3.87–5.11)
RDW: 13.7 % (ref 11.5–15.5)
WBC: 4.9 10*3/uL (ref 4.0–10.5)

## 2015-10-21 LAB — URINALYSIS, ROUTINE W REFLEX MICROSCOPIC
Bilirubin Urine: NEGATIVE
Glucose, UA: NEGATIVE mg/dL
Hgb urine dipstick: NEGATIVE
Ketones, ur: NEGATIVE mg/dL
Leukocytes, UA: NEGATIVE
Nitrite: NEGATIVE
Protein, ur: NEGATIVE mg/dL
Specific Gravity, Urine: 1.028 (ref 1.005–1.030)
pH: 5 (ref 5.0–8.0)

## 2015-10-21 LAB — C DIFFICILE QUICK SCREEN W PCR REFLEX
C Diff antigen: POSITIVE — AB
C Diff toxin: NEGATIVE

## 2015-10-21 LAB — LIPASE, BLOOD: Lipase: 28 U/L (ref 11–51)

## 2015-10-21 LAB — I-STAT BETA HCG BLOOD, ED (MC, WL, AP ONLY): I-stat hCG, quantitative: <5 m[IU]/mL (ref <5)

## 2015-10-21 MED ORDER — ONDANSETRON HCL 4 MG/2ML IJ SOLN
4.0000 mg | Freq: Once | INTRAMUSCULAR | Status: AC
  Administered 2015-10-21: 4 mg via INTRAVENOUS
  Filled 2015-10-21: qty 2

## 2015-10-21 MED ORDER — HYDROMORPHONE HCL 1 MG/ML IJ SOLN
0.5000 mg | Freq: Once | INTRAMUSCULAR | Status: AC
  Administered 2015-10-21: 0.5 mg via INTRAVENOUS
  Filled 2015-10-21: qty 1

## 2015-10-21 MED ORDER — LOPERAMIDE HCL 2 MG PO CAPS: 2.0000 mg | ORAL_CAPSULE | Freq: Four times a day (QID) | ORAL | Status: DC | PRN

## 2015-10-21 NOTE — Discharge Instructions (Signed)
Continue your antibiotics and nausea medication as prescribed. Take Imodium as prescribed as needed for diarrhea. Follow-up with primary care doctor.  Diarrhea Diarrhea is frequent loose and watery bowel movements. It can cause you to feel weak and dehydrated. Dehydration can cause you to become tired and thirsty, have a dry mouth, and have decreased urination that often is dark yellow. Diarrhea is a sign of another problem, most often an infection that will not last long. In most cases, diarrhea typically lasts 2-3 days. However, it can last longer if it is a sign of something more serious. It is important to treat your diarrhea as directed by your caregiver to lessen or prevent future episodes of diarrhea. CAUSES  Some common causes include:  Gastrointestinal infections caused by viruses, bacteria, or parasites.  Food poisoning or food allergies.  Certain medicines, such as antibiotics, chemotherapy, and laxatives.  Artificial sweeteners and fructose.  Digestive disorders. HOME CARE INSTRUCTIONS  Ensure adequate fluid intake (hydration): Have 1 cup (8 oz) of fluid for each diarrhea episode. Avoid fluids that contain simple sugars or sports drinks, fruit juices, whole milk products, and sodas. Your urine should be clear or pale yellow if you are drinking enough fluids. Hydrate with an oral rehydration solution that you can purchase at pharmacies, retail stores, and online. You can prepare an oral rehydration solution at home by mixing the following ingredients together:   - tsp table salt.   tsp baking soda.   tsp salt substitute containing potassium chloride.  1  tablespoons sugar.  1 L (34 oz) of water.  Certain foods and beverages may increase the speed at which food moves through the gastrointestinal (GI) tract. These foods and beverages should be avoided and include:  Caffeinated and alcoholic beverages.  High-fiber foods, such as raw fruits and vegetables, nuts, seeds, and  whole grain breads and cereals.  Foods and beverages sweetened with sugar alcohols, such as xylitol, sorbitol, and mannitol.  Some foods may be well tolerated and may help thicken stool including:  Starchy foods, such as rice, toast, pasta, low-sugar cereal, oatmeal, grits, baked potatoes, crackers, and bagels.  Bananas.  Applesauce.  Add probiotic-rich foods to help increase healthy bacteria in the GI tract, such as yogurt and fermented milk products.  Wash your hands well after each diarrhea episode.  Only take over-the-counter or prescription medicines as directed by your caregiver.  Take a warm bath to relieve any burning or pain from frequent diarrhea episodes. SEEK IMMEDIATE MEDICAL CARE IF:   You are unable to keep fluids down.  You have persistent vomiting.  You have blood in your stool, or your stools are black and tarry.  You do not urinate in 6-8 hours, or there is only a small amount of very dark urine.  You have abdominal pain that increases or localizes.  You have weakness, dizziness, confusion, or light-headedness.  You have a severe headache.  Your diarrhea gets worse or does not get better.  You have a fever or persistent symptoms for more than 2-3 days.  You have a fever and your symptoms suddenly get worse. MAKE SURE YOU:   Understand these instructions.  Will watch your condition.  Will get help right away if you are not doing well or get worse.   This information is not intended to replace advice given to you by your health care provider. Make sure you discuss any questions you have with your health care provider.   Document Released: 09/17/2002 Document Revised: 10/18/2014  Document Reviewed: 06/04/2012 Elsevier Interactive Patient Education [employer]2016 [employer] Inc[employer]

## 2015-10-21 NOTE — ED Provider Notes (Signed)
CSN: [id number]     Arrival date & time 10/21/15  1157 History   First MD Initiated Contact with Patient 10/21/15 1214     Chief Complaint  Patient presents with  . Emesis  . Diarrhea     (Consider location/radiation/quality/duration/timing/severity/associated sxs/prior Treatment) HPI Linda Chen is a 30 y.o. female with history of asthma, kidney stones, cholecystitis, presents to emergency department complaining of left flank pain and diarrhea. Patient states she was seen approximately week ago for left flank pain. She was worked up and diagnosed with pyelonephritis. She states she was started on Keflex for infection and Zofran for nausea. She has been taking medications but states her pain continues. She also reports that she started having diarrhea yesterday. She states this morning, it was "like water pouring out of my backside." She reports associated nausea, states vomiting has subsided on medications. She denies any urinary frequency or urgency. There is no dysuria. No fevers. She states she has history of kidney stone and she states she was told she had kidney stones one week ago.  Past Medical History  Diagnosis Date  . Asthma   . Dysrhythmia, cardiac   . Kidney stone   . Chlamydia     Pt reports treated 12/2012; no further contact with that partner  . Cholecystitis    Past Surgical History  Procedure Laterality Date  . Knee surgery     Family History  Problem Relation Age of Onset  . Stroke Mother   . Gout Father    Social History  Substance Use Topics  . Smoking status: Never Smoker   . Smokeless tobacco: Never Used  . Alcohol Use: No     Comment: social   OB History    Gravida Para Term Preterm AB TAB SAB Ectopic Multiple Living   2 2 2       2      Review of Systems  Constitutional: Negative for fever and chills.  Respiratory: Negative for cough, chest tightness and shortness of breath.   Cardiovascular: Negative for chest pain, palpitations and leg  swelling.  Gastrointestinal: Positive for nausea, abdominal pain and diarrhea. Negative for vomiting.  Genitourinary: Positive for flank pain. Negative for dysuria, vaginal bleeding, vaginal discharge, vaginal pain and pelvic pain.  Musculoskeletal: Negative for myalgias, arthralgias, neck pain and neck stiffness.  Skin: Negative for rash.  Neurological: Negative for dizziness, weakness and headaches.  All other systems reviewed and are negative.     Allergies  Review of patient's allergies indicates no known allergies.  Home Medications   Prior to Admission medications   Medication Sig Start Date End Date Taking? Authorizing Provider  bisacodyl (DULCOLAX) 10 MG suppository Place 10 mg rectally daily as needed for moderate constipation.    Historical Provider, MD  cephALEXin (KEFLEX) 500 MG capsule Take 1 capsule (500 mg total) by mouth 3 (three) times daily. 10/13/15   Alvira Monday, MD  Etonogestrel (IMPLANON [place]) Inject 1 each into the skin once. Placed in 2011.    Historical Provider, MD  ferrous sulfate 325 (65 FE) MG tablet Take 325 mg by mouth daily with breakfast.    Historical Provider, MD  naproxen (NAPROSYN) 500 MG tablet Take 1 tablet (500 mg total) by mouth 2 (two) times daily. 10/13/15   Alvira Monday, MD  ondansetron (ZOFRAN ODT) 4 MG disintegrating tablet Take 1 tablet (4 mg total) by mouth every 8 (eight) hours as needed for nausea or vomiting. 10/13/15   Alvira Monday, MD  Triamcinolone Acetonide (TRIAMCINOLONE 0.1 % CREAM : EUCERIN) CREA Apply 1 application topically daily.    Historical Provider, MD   BP 124/75 mmHg  Pulse 70  Temp(Src) 98.7 F (37.1 C) (Oral)  Resp 22  Ht 6\' 1"  (1.854 m)  Wt 151.048 kg  BMI 43.94 kg/m2  SpO2 99%  LMP 07/06/2015 (Approximate) Physical Exam  Constitutional: She is oriented to person, place, and time. She appears well-developed and well-nourished. No distress.  HENT:  Head: Normocephalic.  Eyes: Conjunctivae are normal.   Neck: Neck supple.  Cardiovascular: Normal rate, regular rhythm and normal heart sounds.   Pulmonary/Chest: Effort normal and breath sounds normal. No respiratory distress. She has no wheezes. She has no rales.  Abdominal: Soft. Bowel sounds are normal. She exhibits no distension. There is tenderness. There is no rebound.  Left CVA tenderness. LLQ tenderness  Musculoskeletal: She exhibits no edema.  Neurological: She is alert and oriented to person, place, and time.  Skin: Skin is warm and dry.  Psychiatric: She has a normal mood and affect. Her behavior is normal.  Nursing note and vitals reviewed.   ED Course  Procedures (including critical care time) Labs Review Labs Reviewed  C DIFFICILE QUICK SCREEN W PCR REFLEX - Abnormal; Notable for the following:    C Diff antigen POSITIVE (*)    All other components within normal limits  COMPREHENSIVE METABOLIC PANEL - Abnormal; Notable for the following:    Glucose, Bld 100 (*)    Creatinine, Ser 1.14 (*)    All other components within normal limits  LIPASE, BLOOD  CBC  URINALYSIS, ROUTINE W REFLEX MICROSCOPIC (NOT AT [facility])  I-STAT BETA HCG BLOOD, ED ([facility], [facility], [facility] ONLY)    Imaging Review Ct Abdomen Pelvis Wo Contrast  10/21/2015  CLINICAL DATA:  Left flank pain.  Left abdominal pain. EXAM: CT ABDOMEN AND PELVIS WITHOUT CONTRAST TECHNIQUE: Multidetector CT imaging of the abdomen and pelvis was performed following the standard protocol without IV contrast. COMPARISON:  10/08/2014 FINDINGS: Lower chest and abdominal wall: Wide necked herniation of fat through the umbilicus. Hepatobiliary: No focal liver abnormality.Cholelithiasis without obstructive or inflammatory change. Pancreas: Unremarkable. Spleen: Unremarkable. Adrenals/Urinary Tract: Negative adrenals. Punctate bilateral nephrolithiasis with 1-2 calculi on both sides. No hydronephrosis or ureteral calculus. Unremarkable bladder. Reproductive:No pathologic findings. Stomach/Bowel:  Fluid levels to the level the rectum. No inflammatory bowel wall thickening. No obstruction. No appendicitis. Vascular/Lymphatic: No acute vascular abnormality. No mass or adenopathy. Peritoneal: No ascites or pneumoperitoneum. Musculoskeletal: No acute abnormalities.  Convincing osteitis ilii. IMPRESSION: 1. Distal colonic fluid levels suggesting diarrheal illness. 2. Punctate bilateral renal calculi. 3. Cholelithiasis without acute cholecystitis. Electronically Signed   By: Marnee SpringJonathon  Watts M.D.   On: 10/21/2015 14:16   I have personally reviewed and evaluated these images and lab results as part of my medical decision-making.   EKG Interpretation None      MDM   Final diagnoses:  Flank pain  Diarrhea, unspecified type    patient seen 1 week ago for the same, complaining of left flank pain. At that time her pelvic exam was unremarkable, her UA showed too numerous to count white blood cells. She was started on Keflex for pyelonephritis. Renal ultrasound was performed and showed a renal calculus. Patient needs to think that she has been passing kidney stone since then. She is here because her pain is not improving, also because she started having diarrhea last night. Patient states she feels dizzy. Patient's vital signs are normal. She  does not appear to be dehydrated on exam. Will recheck urine and labs. Will get CT abdomen and pelvis to evaluate for stone.  CT negative. Pt feels better. UA cleared up. Most likely pain from recent pyelo. Will continue on antibiotics until all gone. Will also get cdiff for culture given diarrhea and recent antibiotics. Pt requestion something for diarrhea. Advised to take imodium. She has zofran prescription. Will have pt follow up with pcp. Return precautions discussed.   Filed Vitals:   10/21/15 1243 10/21/15 1318 10/21/15 1330 10/21/15 1501  BP: 124/75 122/84 126/80 92/52  Pulse: 70 72 73 57  Temp:    99 F (37.2 C)  TempSrc:    Oral  Resp: 22 16 16 16    Height:      Weight:      SpO2: 99% 98% 97% 95%      Jaynie Crumble, PA-C 10/21/15 1635  Azalia Bilis, MD 10/22/15 0820

## 2015-10-21 NOTE — ED Notes (Signed)
Pt sts N/V/D starting yesterday 

## 2015-10-27 ENCOUNTER — Telehealth: Payer: Self-pay | Admitting: *Deleted

## 2015-10-27 NOTE — Telephone Encounter (Signed)
Patient contacted the office requesting an appointment for a Nexplanon Removal.  Attempted to contact the patient and left message for patient to call the office.  

## 2015-11-11 ENCOUNTER — Other Ambulatory Visit (HOSPITAL_COMMUNITY): Payer: Self-pay | Admitting: Internal Medicine

## 2015-11-11 DIAGNOSIS — R1011 Right upper quadrant pain: Secondary | ICD-10-CM

## 2015-11-18 ENCOUNTER — Emergency Department (HOSPITAL_COMMUNITY)
Admission: EM | Admit: 2015-11-18 | Discharge: 2015-11-18 | Disposition: A | Discharge status: Home / Self Care | Payer: BLUE CROSS/BLUE SHIELD | Attending: Emergency Medicine | Admitting: Emergency Medicine

## 2015-11-18 ENCOUNTER — Encounter (HOSPITAL_COMMUNITY): Payer: Self-pay | Admitting: Family Medicine

## 2015-11-18 DIAGNOSIS — J45909 Unspecified asthma, uncomplicated: Secondary | ICD-10-CM | POA: Diagnosis not present

## 2015-11-18 DIAGNOSIS — R1110 Vomiting, unspecified: Secondary | ICD-10-CM | POA: Insufficient documentation

## 2015-11-18 DIAGNOSIS — R1011 Right upper quadrant pain: Secondary | ICD-10-CM | POA: Diagnosis present

## 2015-11-18 LAB — URINALYSIS, ROUTINE W REFLEX MICROSCOPIC
Bilirubin Urine: NEGATIVE
Glucose, UA: NEGATIVE mg/dL
Ketones, ur: NEGATIVE mg/dL
Leukocytes, UA: NEGATIVE
Nitrite: NEGATIVE
Protein, ur: NEGATIVE mg/dL
Specific Gravity, Urine: 1.022 (ref 1.005–1.030)
pH: 5.5 (ref 5.0–8.0)

## 2015-11-18 LAB — COMPREHENSIVE METABOLIC PANEL
ALT: 32 U/L (ref 14–54)
AST: 25 U/L (ref 15–41)
Albumin: 3.8 g/dL (ref 3.5–5.0)
Alkaline Phosphatase: 50 U/L (ref 38–126)
Anion gap: 9 (ref 5–15)
BUN: 17 mg/dL (ref 6–20)
CO2: 24 mmol/L (ref 22–32)
Calcium: 8.8 mg/dL — ABNORMAL LOW (ref 8.9–10.3)
Chloride: 109 mmol/L (ref 101–111)
Creatinine, Ser: 1.09 mg/dL — ABNORMAL HIGH (ref 0.44–1.00)
GFR calc Af Amer: >60 mL/min (ref >60)
GFR calc non Af Amer: >60 mL/min (ref >60)
Glucose, Bld: 98 mg/dL (ref 65–99)
Potassium: 3.8 mmol/L (ref 3.5–5.1)
Sodium: 142 mmol/L (ref 135–145)
Total Bilirubin: 0.2 mg/dL — ABNORMAL LOW (ref 0.3–1.2)
Total Protein: 7.3 g/dL (ref 6.5–8.1)

## 2015-11-18 LAB — CBC
HCT: 38.2 % (ref 36.0–46.0)
Hemoglobin: 12 g/dL (ref 12.0–15.0)
MCH: 28.5 pg (ref 26.0–34.0)
MCHC: 31.4 g/dL (ref 30.0–36.0)
MCV: 90.7 fL (ref 78.0–100.0)
Platelets: 312 10*3/uL (ref 150–400)
RBC: 4.21 MIL/uL (ref 3.87–5.11)
RDW: 13.5 % (ref 11.5–15.5)
WBC: 6.3 10*3/uL (ref 4.0–10.5)

## 2015-11-18 LAB — POC URINE PREG, ED: Preg Test, Ur: NEGATIVE

## 2015-11-18 LAB — URINE MICROSCOPIC-ADD ON

## 2015-11-18 LAB — LIPASE, BLOOD: Lipase: 33 U/L (ref 11–51)

## 2015-11-18 NOTE — ED Notes (Signed)
Pt reports she is experiencing right upper and mid abd pain that started around noon today. Vomiting associated with pain. Pt reports it is her gallbladder that is bothering her. She states does not know of anything that caused her abd pain. Pt took ZOFRAN  ODT at 13:30.

## 2015-11-19 ENCOUNTER — Ambulatory Visit (HOSPITAL_COMMUNITY)
Admission: RE | Admit: 2015-11-19 | Discharge: 2015-11-19 | Disposition: A | Discharge status: Home / Self Care | Payer: BLUE CROSS/BLUE SHIELD | Source: Ambulatory Visit | Attending: Internal Medicine | Admitting: Internal Medicine

## 2015-11-19 DIAGNOSIS — R932 Abnormal findings on diagnostic imaging of liver and biliary tract: Secondary | ICD-10-CM | POA: Diagnosis not present

## 2015-11-19 DIAGNOSIS — R1011 Right upper quadrant pain: Secondary | ICD-10-CM

## 2015-11-19 DIAGNOSIS — K8020 Calculus of gallbladder without cholecystitis without obstruction: Secondary | ICD-10-CM | POA: Insufficient documentation

## 2015-11-19 MED ORDER — TECHNETIUM TC 99M MEBROFENIN IV KIT
5.2000 | PACK | Freq: Once | INTRAVENOUS | Status: AC | PRN
  Administered 2015-11-19: 5 via INTRAVENOUS

## 2016-01-02 ENCOUNTER — Telehealth: Payer: Self-pay | Admitting: *Deleted

## 2016-01-02 NOTE — Telephone Encounter (Signed)
Error

## 2016-01-09 NOTE — Telephone Encounter (Signed)
No return call to the office. Call to be re-filed.  

## 2016-12-10 ENCOUNTER — Encounter (HOSPITAL_COMMUNITY): Payer: Self-pay | Admitting: Emergency Medicine

## 2016-12-10 ENCOUNTER — Ambulatory Visit (HOSPITAL_COMMUNITY)
Admission: EM | Admit: 2016-12-10 | Discharge: 2016-12-10 | Disposition: A | Discharge status: Home / Self Care | Payer: BLUE CROSS/BLUE SHIELD | Attending: Family Medicine | Admitting: Family Medicine

## 2016-12-10 DIAGNOSIS — L308 Other specified dermatitis: Secondary | ICD-10-CM

## 2016-12-10 DIAGNOSIS — B028 Zoster with other complications: Secondary | ICD-10-CM

## 2016-12-10 MED ORDER — VALACYCLOVIR HCL 1 G PO TABS: 1000.0000 mg | ORAL_TABLET | Freq: Three times a day (TID) | ORAL | 0 refills | Status: DC

## 2016-12-10 NOTE — ED Provider Notes (Signed)
[facility]    CSN: [id number] Arrival date & time: 12/10/16  1911     History   Chief Complaint Chief Complaint  Patient presents with  . Rash    HPI Linda Chen is a 31 y.o. female.   The history is provided by the patient.  Rash  Location:  Torso Torso rash location:  L flank Quality: blistering   Severity:  Mild Onset quality:  Gradual Duration:  2 days Progression:  Spreading Chronicity:  New   Past Medical History:  Diagnosis Date  . Asthma   . Chlamydia    Pt reports treated 12/2012; no further contact with that partner  . Cholecystitis   . Dysrhythmia, cardiac   . Kidney stone     Patient Active Problem List   Diagnosis Date Noted  . Screening examination for STD (sexually transmitted disease) 09/13/2013  . Vaginitis 09/13/2013    Past Surgical History:  Procedure Laterality Date  . CHOLECYSTECTOMY    . KNEE SURGERY      OB History    Gravida Para Term Preterm AB Living   2 2 2     2    SAB TAB Ectopic Multiple Live Births                   Home Medications    Prior to Admission medications   Medication Sig Start Date End Date Taking? Authorizing Provider  ferrous sulfate 325 (65 FE) MG tablet Take 325 mg by mouth daily with breakfast.   Yes Historical Provider, MD  bisacodyl (DULCOLAX) 10 MG suppository Place 10 mg rectally daily as needed for moderate constipation.    Historical Provider, MD  cephALEXin (KEFLEX) 500 MG capsule Take 1 capsule (500 mg total) by mouth 3 (three) times daily. 10/13/15   Alvira MondayErin Schlossman, MD  Etonogestrel (IMPLANON [place]) Inject 1 each into the skin once. Placed in 2011.    Historical Provider, MD  loperamide (IMODIUM) 2 MG capsule Take 1 capsule (2 mg total) by mouth 4 (four) times daily as needed for diarrhea or loose stools. 10/21/15   Tatyana Kirichenko, PA-C  naproxen (NAPROSYN) 500 MG tablet Take 1 tablet (500 mg total) by mouth 2 (two) times daily. Patient taking differently: Take 500 mg by  mouth 2 (two) times daily as needed (pain).  10/13/15   Alvira MondayErin Schlossman, MD  ondansetron (ZOFRAN ODT) 4 MG disintegrating tablet Take 1 tablet (4 mg total) by mouth every 8 (eight) hours as needed for nausea or vomiting. 10/13/15   Alvira MondayErin Schlossman, MD  Triamcinolone Acetonide (TRIAMCINOLONE 0.1 % CREAM : EUCERIN) CREA Apply 1 application topically daily.    Historical Provider, MD  valACYclovir (VALTREX) 1000 MG tablet Take 1 tablet (1,000 mg total) by mouth 3 (three) times daily. 12/10/16   Linna HoffJames D Taneil Lazarus, MD    Family History Family History  Problem Relation Age of Onset  . Stroke Mother   . Gout Father     Social History Social History  Substance Use Topics  . Smoking status: Never Smoker  . Smokeless tobacco: Never Used  . Alcohol use No     Allergies   Patient has no known allergies.   Review of Systems Review of Systems  Constitutional: Negative.   Respiratory: Negative.   Cardiovascular: Negative.   Gastrointestinal: Negative.   Genitourinary: Negative.   Skin: Positive for rash.  All other systems reviewed and are negative.    Physical Exam Triage Vital Signs ED Triage Vitals  Enc Vitals Group     BP 12/10/16 1933 118/61     Pulse Rate 12/10/16 1933 86     Resp 12/10/16 1933 19     Temp 12/10/16 1933 98.4 F (36.9 C)     Temp Source 12/10/16 1933 Oral     SpO2 12/10/16 1933 100 %     Weight --      Height --      Head Circumference --      Peak Flow --      Pain Score 12/10/16 1942 10     Pain Loc --      Pain Edu? --      Excl. in GC? --    No data found.   Updated Vital Signs BP 118/61 (BP Location: Left Arm)   Pulse 86   Temp 98.4 F (36.9 C) (Oral)   Resp 19   LMP 11/22/2016 (Exact Date)   SpO2 100%   Visual Acuity Right Eye Distance:   Left Eye Distance:   Bilateral Distance:    Right Eye Near:   Left Eye Near:    Bilateral Near:     Physical Exam  Constitutional: She is oriented to person, place, and time. She appears  well-developed and well-nourished.  Neurological: She is alert and oriented to person, place, and time.  Skin: Skin is warm and dry. Rash noted. There is erythema.  erythenatous based grouped vesicular rash , tender, no infection.  Nursing note and vitals reviewed.    UC Treatments / Results  Labs (all labs ordered are listed, but only abnormal results are displayed) Labs Reviewed - No data to display  EKG  EKG Interpretation None       Radiology No results found.  Procedures Procedures (including critical care time)  Medications Ordered in UC Medications - No data to display   Initial Impression / Assessment and Plan / UC Course  I have reviewed the triage vital signs and the nursing notes.  Pertinent labs & imaging results that were available during my care of the patient were reviewed by me and considered in my medical decision making (see chart for details).      Final Clinical Impressions(s) / UC Diagnoses   Final diagnoses:  Herpes zoster dermatitis    New Prescriptions Discharge Medication List as of 12/10/2016  8:08 PM    START taking these medications   Details  valACYclovir (VALTREX) 1000 MG tablet Take 1 tablet (1,000 mg total) by mouth 3 (three) times daily., Starting Fri 12/10/2016, Print         Linna Hoff, MD 12/10/16 2039

## 2016-12-10 NOTE — ED Triage Notes (Signed)
Pt noticed a rash on her right lower back, wednesday night and placed OTC ointment over the rash with no relief. Pt states the rash is painful. Pt also states the same rash is appearing on her face. [facility].

## 2016-12-26 ENCOUNTER — Ambulatory Visit (HOSPITAL_COMMUNITY)
Admission: EM | Admit: 2016-12-26 | Discharge: 2016-12-26 | Disposition: A | Discharge status: Home / Self Care | Payer: Self-pay | Attending: Family Medicine | Admitting: Family Medicine

## 2016-12-26 ENCOUNTER — Encounter (HOSPITAL_COMMUNITY): Payer: Self-pay | Admitting: *Deleted

## 2016-12-26 DIAGNOSIS — J028 Acute pharyngitis due to other specified organisms: Secondary | ICD-10-CM

## 2016-12-26 DIAGNOSIS — B9789 Other viral agents as the cause of diseases classified elsewhere: Secondary | ICD-10-CM

## 2016-12-26 DIAGNOSIS — J029 Acute pharyngitis, unspecified: Secondary | ICD-10-CM | POA: Insufficient documentation

## 2016-12-26 LAB — POCT RAPID STREP A: Streptococcus, Group A Screen (Direct): NEGATIVE

## 2016-12-26 MED ORDER — METHYLPREDNISOLONE ACETATE 40 MG/ML IJ SUSP
INTRAMUSCULAR | Status: AC
  Filled 2016-12-26: qty 1

## 2016-12-26 MED ORDER — METHYLPREDNISOLONE ACETATE 40 MG/ML IJ SUSP
40.0000 mg | Freq: Once | INTRAMUSCULAR | Status: AC
  Administered 2016-12-26: 40 mg via INTRAMUSCULAR

## 2016-12-26 MED ORDER — GUAIFENESIN-CODEINE 100-10 MG/5ML PO SOLN: 5.0000 mL | Freq: Three times a day (TID) | ORAL | 0 refills | Status: DC | PRN

## 2016-12-26 MED ORDER — MAGIC MOUTHWASH W/LIDOCAINE: 5.0000 mL | Freq: Three times a day (TID) | ORAL | 0 refills | Status: DC | PRN

## 2016-12-26 NOTE — Discharge Instructions (Signed)
You most likely have a viral URI, I advise rest, plenty of fluids and management of symptoms with over the counter medicines. For symptoms you may take Tylenol as needed every 4-6 hours for body aches or fever, not to exceed 4,000 mg a day, Take mucinex or mucinex DM ever 12 hours with a full glass of water, you may use an inhaled steroid such as Flonase, 2 sprays each nostril once a day for congestion, or an antihistamine such as Claritin or Zyrtec once a day. I have also prescribed a medicine for cough called Cheratussin, this medicine is a narcotic, it will cause drowsiness, and it is addictive. Do not take more than what is necessary, do not drink alcohol while taking, and do not operate any heavy machinery while taking this medicine. Should your symptoms worsen or fail to resolve, follow up with your primary care provider or return to clinic.

## 2016-12-26 NOTE — ED Provider Notes (Signed)
CSN: [id number]     Arrival date & time 12/26/16  1329 History   First MD Initiated Contact with Patient 12/26/16 1423     Chief Complaint  Patient presents with  . Sore Throat  . Fever   (Consider location/radiation/quality/duration/timing/severity/associated sxs/prior Treatment) 31 year old female presents with a chief complaint of sore throat for 2-3 days.   The history is provided by the patient.  Sore Throat  This is a new problem. The current episode started more than 2 days ago. The problem occurs constantly. The problem has been gradually worsening. Pertinent negatives include no chest pain, no abdominal pain, no headaches and no shortness of breath. The symptoms are aggravated by swallowing, coughing and eating. Nothing relieves the symptoms. She has tried rest, acetaminophen and water for the symptoms. The treatment provided no relief.  Fever  Max temp prior to arrival:  103 Temp source:  Oral Severity:  Moderate Onset quality:  Gradual Duration:  2 days Timing:  Intermittent Progression:  Waxing and waning Chronicity:  New Relieved by:  Acetaminophen Worsened by:  Nothing Associated symptoms: chills, cough and sore throat   Associated symptoms: no chest pain, no confusion, no diarrhea, no dysuria, no headaches, no myalgias, no nausea, no rhinorrhea and no vomiting   Cough:    Cough characteristics:  Non-productive, dry and hacking   Sputum characteristics:  Clear   Severity:  Moderate   Onset quality:  Gradual   Duration:  3 days   Timing:  Intermittent   Progression:  Waxing and waning   Chronicity:  New Risk factors: sick contacts     Past Medical History:  Diagnosis Date  . Asthma   . Chlamydia    Pt reports treated 12/2012; no further contact with that partner  . Cholecystitis   . Dysrhythmia, cardiac   . Kidney stone    Past Surgical History:  Procedure Laterality Date  . CHOLECYSTECTOMY    . KNEE SURGERY     Family History  Problem Relation Age  of Onset  . Stroke Mother   . Gout Father    Social History  Substance Use Topics  . Smoking status: Never Smoker  . Smokeless tobacco: Never Used  . Alcohol use No   OB History    Gravida Para Term Preterm AB Living   2 2 2     2    SAB TAB Ectopic Multiple Live Births                 Review of Systems  Constitutional: Positive for chills and fever.  HENT: Positive for sore throat. Negative for rhinorrhea.   Respiratory: Positive for cough. Negative for shortness of breath.   Cardiovascular: Negative for chest pain.  Gastrointestinal: Negative for abdominal pain, diarrhea, nausea and vomiting.  Genitourinary: Negative for dysuria.  Musculoskeletal: Negative for myalgias.  Neurological: Negative for headaches.  Psychiatric/Behavioral: Negative for confusion.  All other systems reviewed and are negative.   Allergies  Patient has no known allergies.  Home Medications   Prior to Admission medications   Medication Sig Start Date End Date Taking? Authorizing Provider  Etonogestrel (IMPLANON [place]) Inject 1 each into the skin once. Placed in 2011.   Yes Historical Provider, MD  guaiFENesin-codeine 100-10 MG/5ML syrup Take 5 mLs by mouth 3 (three) times daily as needed for cough. 12/26/16   Dorena Bodo, NP  magic mouthwash w/lidocaine SOLN Take 5 mLs by mouth 3 (three) times daily as needed for mouth pain. 12/26/16  Dorena BodoLawrence Vue Pavon, NP   Meds Ordered and Administered this Visit   Medications  methylPREDNISolone acetate (DEPO-MEDROL) injection 40 mg (40 mg Intramuscular Given 12/26/16 1437)    BP 108/64   Pulse 78   Temp 98.2 F (36.8 C) (Oral)   Resp 20   SpO2 100%  No data found.   Physical Exam  Constitutional: She is oriented to person, place, and time. She appears well-developed and well-nourished. She does not have a sickly appearance. She does not appear ill. No distress.  HENT:  Head: Normocephalic and atraumatic.  Right Ear: Tympanic membrane and external  ear normal.  Left Ear: Tympanic membrane and external ear normal.  Nose: Nose normal. Right sinus exhibits no maxillary sinus tenderness and no frontal sinus tenderness. Left sinus exhibits no maxillary sinus tenderness and no frontal sinus tenderness.  Mouth/Throat: Uvula is midline, oropharynx is clear and moist and mucous membranes are normal. No oropharyngeal exudate. Tonsils are 0 on the right. Tonsils are 0 on the left. No tonsillar exudate.  Eyes: Pupils are equal, round, and reactive to light.  Neck: Normal range of motion. Neck supple. No JVD present.  Cardiovascular: Normal rate and regular rhythm.   Pulmonary/Chest: Effort normal and breath sounds normal. No respiratory distress. She has no wheezes.  Abdominal: Soft. Bowel sounds are normal. She exhibits no distension. There is no tenderness. There is no guarding.  Musculoskeletal: She exhibits no edema or tenderness.  Lymphadenopathy:       Head (right side): No submental, no submandibular, no tonsillar and no preauricular adenopathy present.       Head (left side): No submental, no submandibular, no tonsillar and no preauricular adenopathy present.    She has no cervical adenopathy.  Neurological: She is alert and oriented to person, place, and time.  Skin: Skin is warm and dry. Capillary refill takes less than 2 seconds. She is not diaphoretic.  Psychiatric: She has a normal mood and affect. Her behavior is normal.  Nursing note and vitals reviewed.   Urgent Care Course     Procedures (including critical care time)  Labs Review Labs Reviewed  POCT RAPID STREP A    Imaging Review No results found.   MDM   1. Viral pharyngitis    Strep test negative, physical exam findings inconsistent with strep pharyngitis, most likely viral URI. Prescriptions given for Cheratussin, magic mouthwash, counseling provided on over-the-counter medicine for symptom relief.    Dorena BodoLawrence Pravin Perezperez, NP 12/26/16 1524

## 2016-12-26 NOTE — ED Triage Notes (Signed)
Reports recently treated for shingles.  Started with cough, aches, severe sore throat x approx 5 days.  Reports fevers up to 103 over past 2 days.

## 2016-12-29 LAB — CULTURE, GROUP A STREP (THRC)

## 2017-01-26 IMAGING — CR DG ABDOMEN 1V
2 series · 2 of 2 positions shown · non-contrast
Comparison: 10/08/2014 CT

CLINICAL DATA: 29-year-old female with left flank pain and vomiting
today.

EXAM:
ABDOMEN - 1 VIEW

[t abdomen supine (1 of 2)]
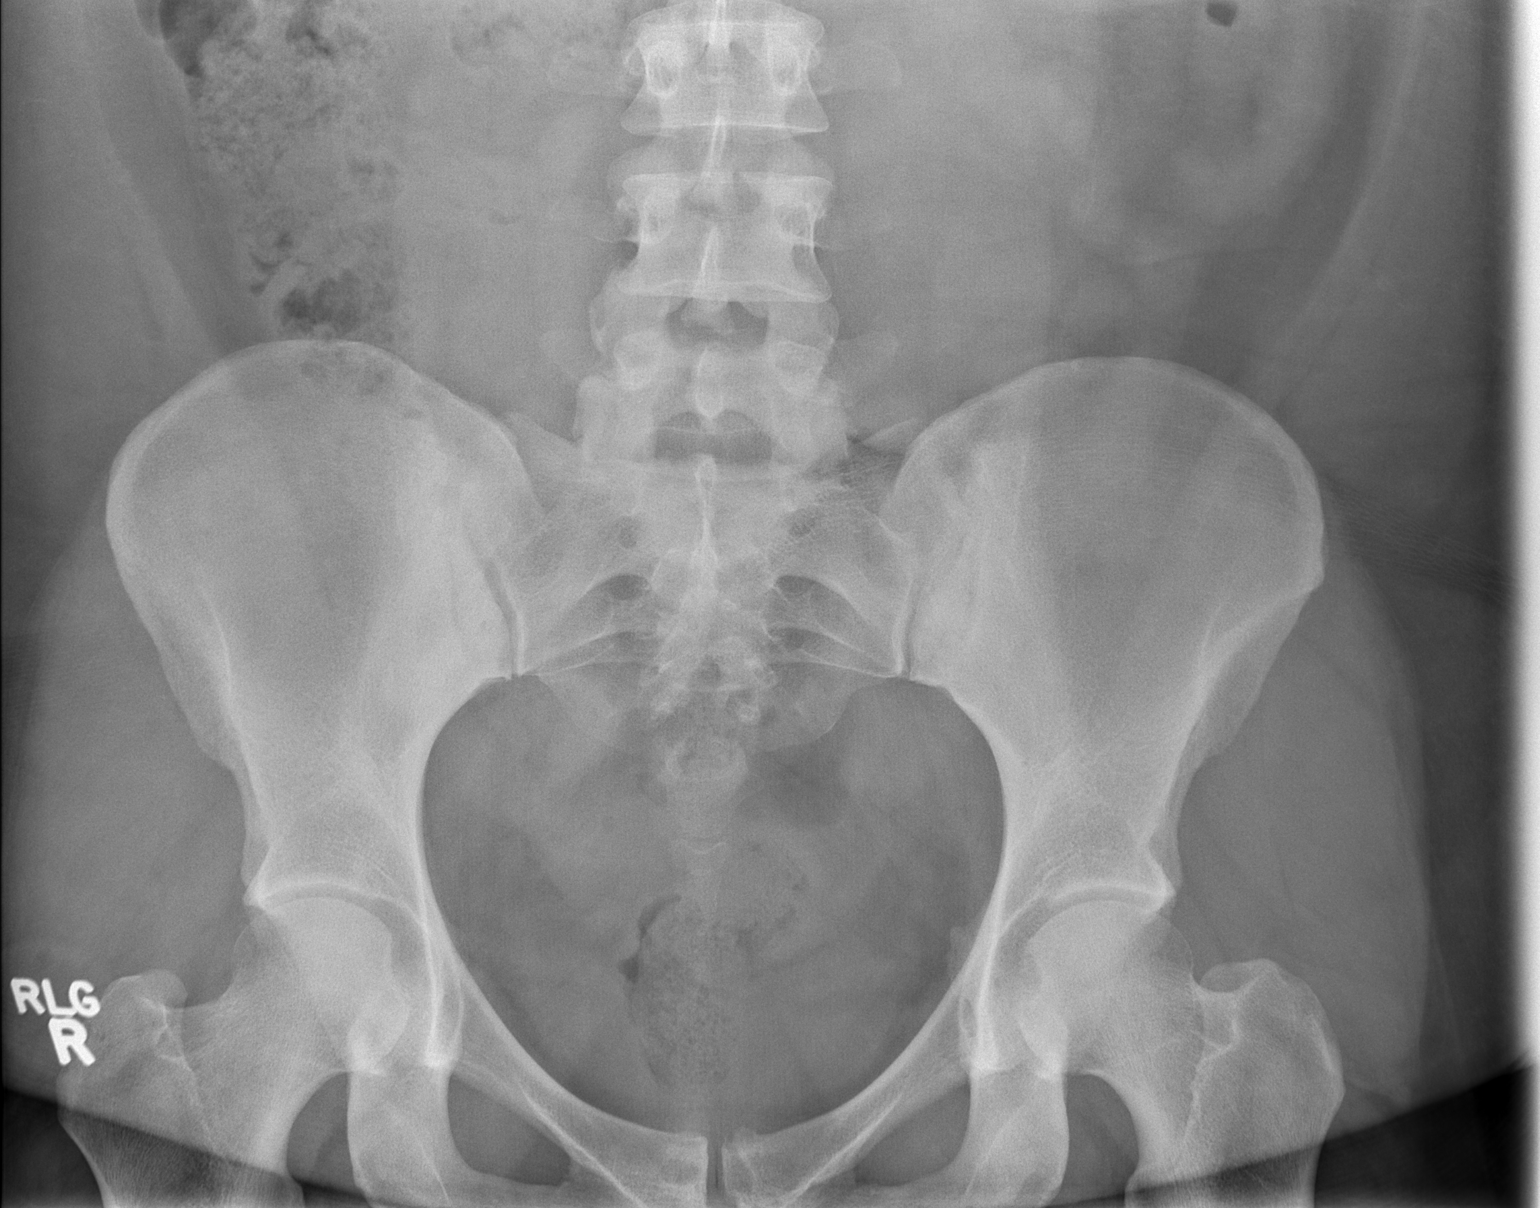

[t abdomen supine (2 of 2)]
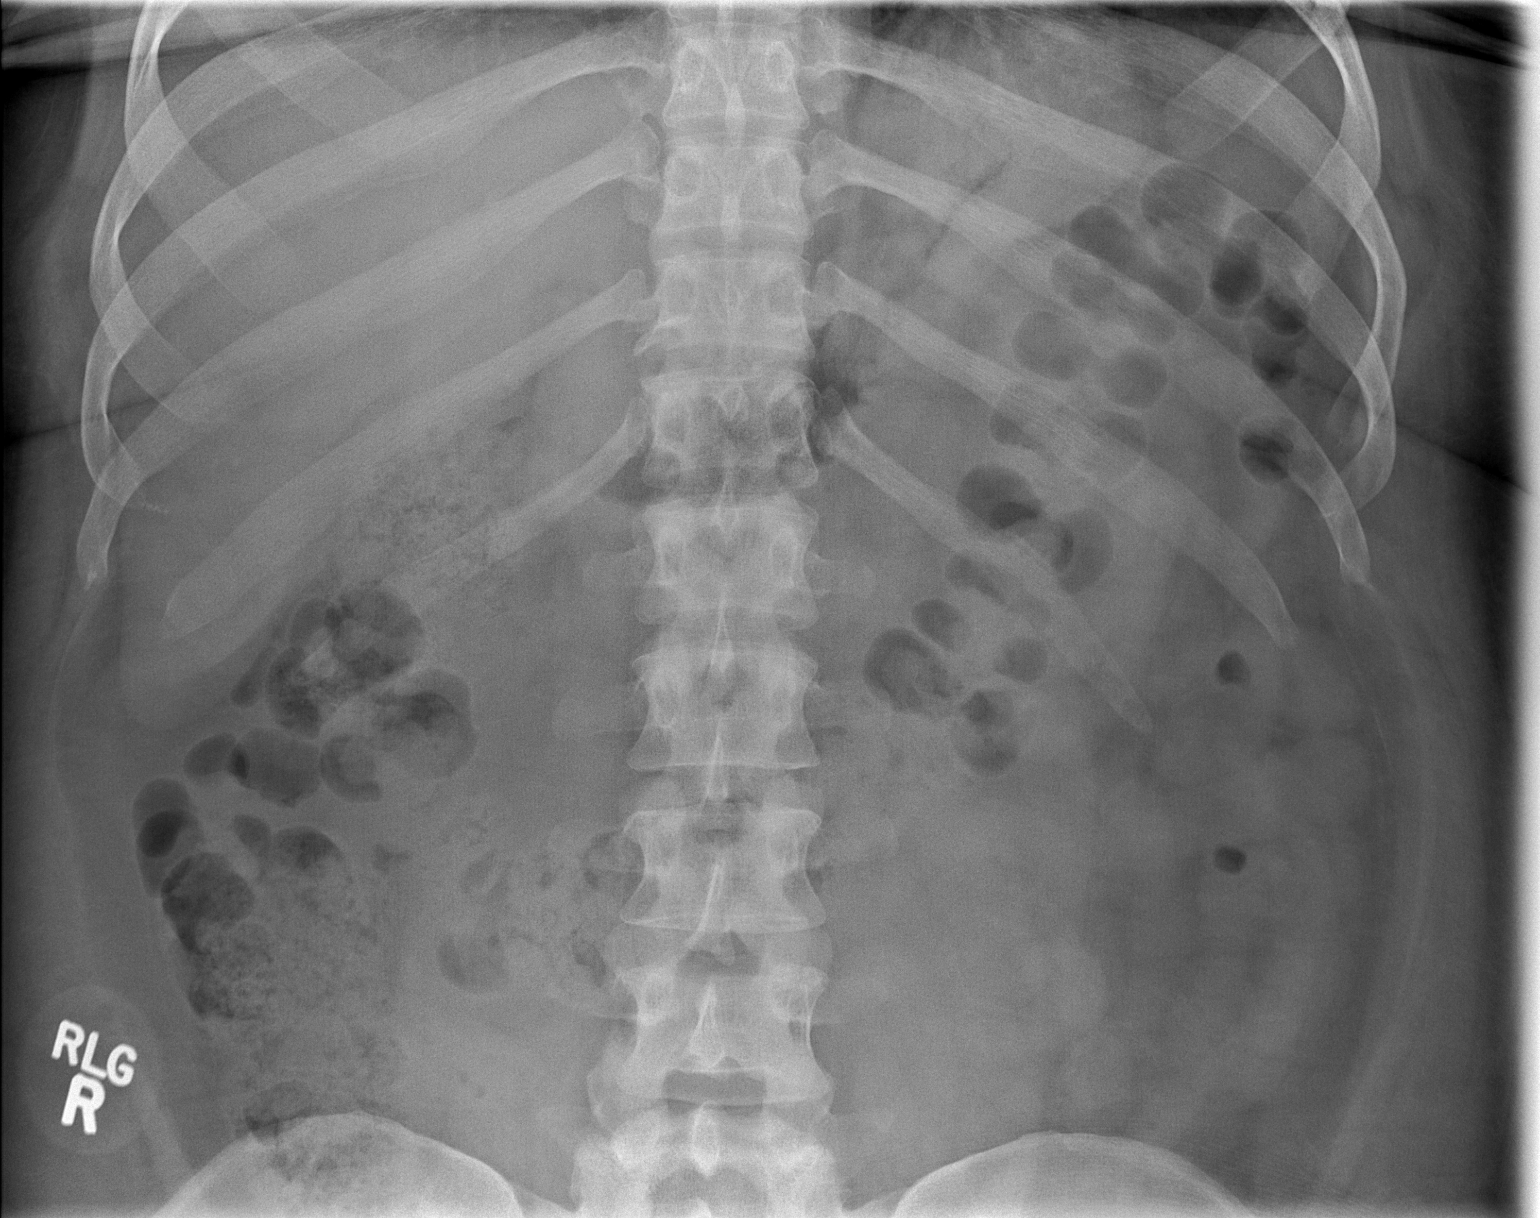

[2 of 2 positions shown; findings below may reference images not displayed]

FINDINGS: No radiopaque calculi are identified.

Bowel gas pattern is unremarkable.

The bony structures are within normal limits.
IMPRESSION: Negative.

## 2017-02-03 IMAGING — CT CT ABD-PELV W/O CM
2 of 4 series · 17 of 46 positions shown, 19 images · non-contrast
Comparison: 10/08/2014

CLINICAL DATA: Left flank pain.  Left abdominal pain.

EXAM:
CT ABDOMEN AND PELVIS WITHOUT CONTRAST
TECHNIQUE: Multidetector CT imaging of the abdomen and pelvis was performed
following the standard protocol without IV contrast.

[Series 2: renal stone 5mm · axial · 0.98mm/px · z∈[-576,-76]mm · 14 of 110 slices shown, 16 images]
[im 5/110  soft-tissue]
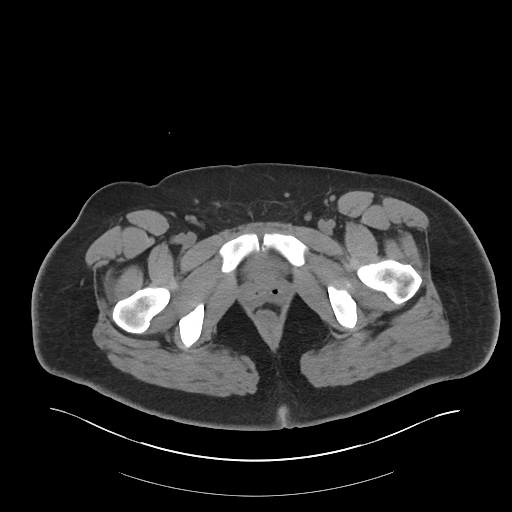
[im 5/110  bone]
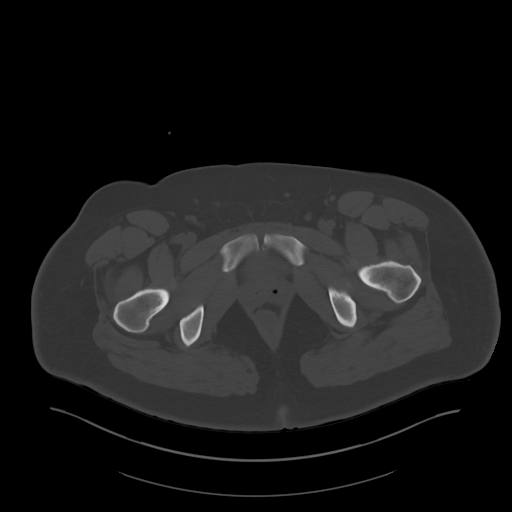
[im 15/110  soft-tissue]
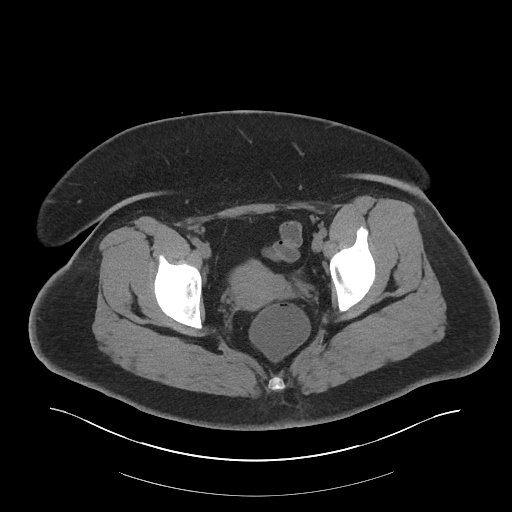
[im 20/110  soft-tissue]
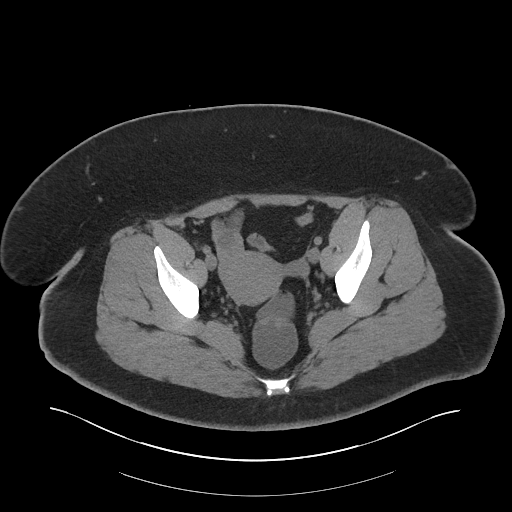
[im 30/110  soft-tissue]
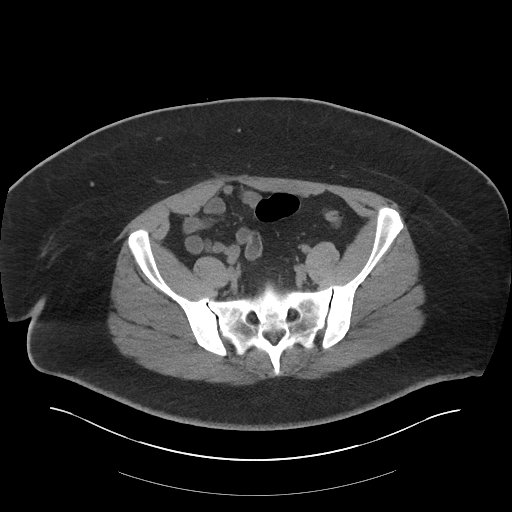
[im 35/110  soft-tissue]
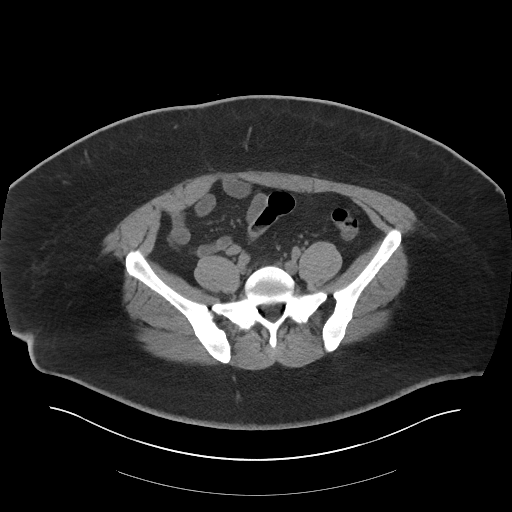
[im 45/110  soft-tissue]
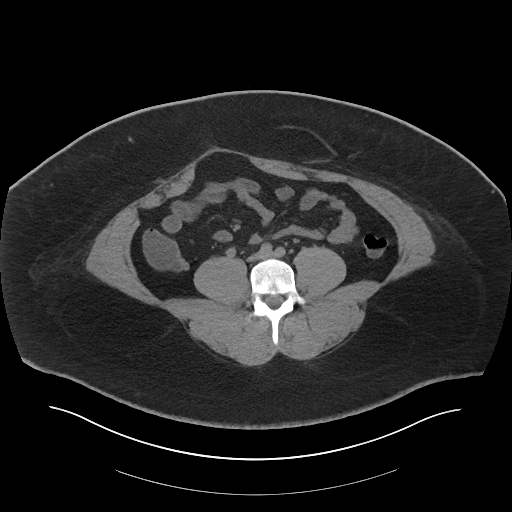
[im 50/110  soft-tissue]
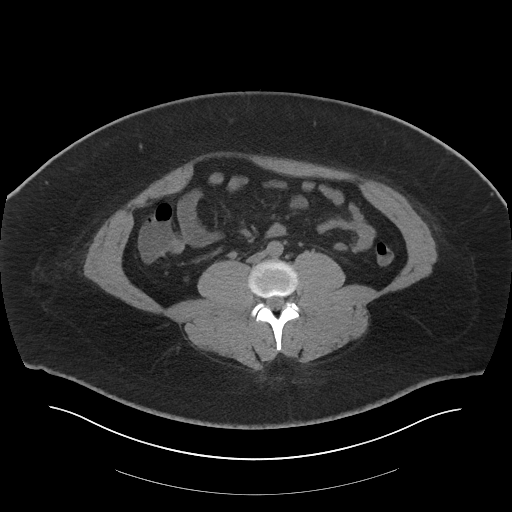
[im 60/110  soft-tissue]
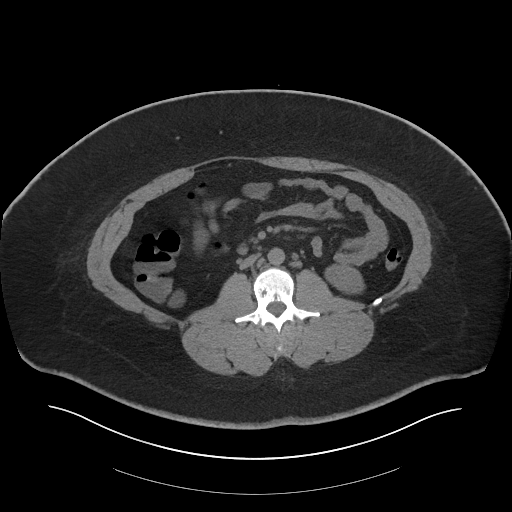
[im 65/110  soft-tissue]
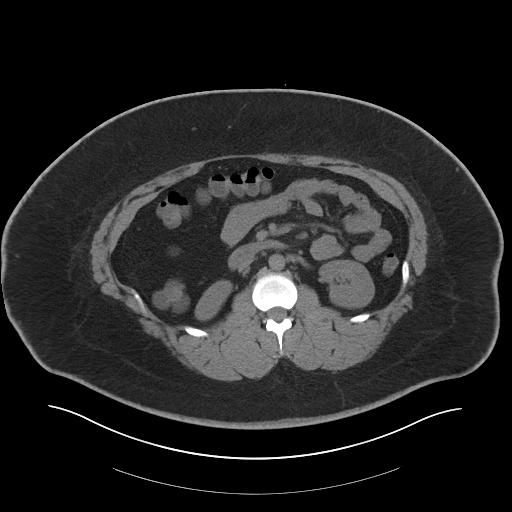
[im 65/110  bone]
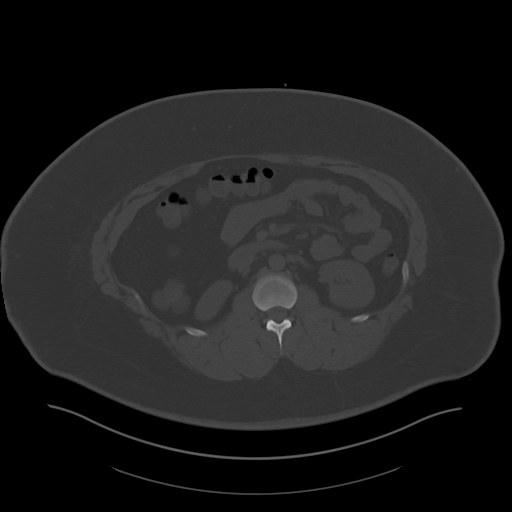
[im 75/110  soft-tissue]
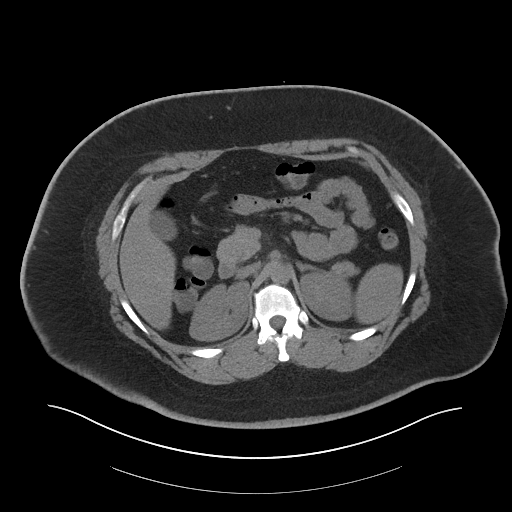
[im 80/110  soft-tissue]
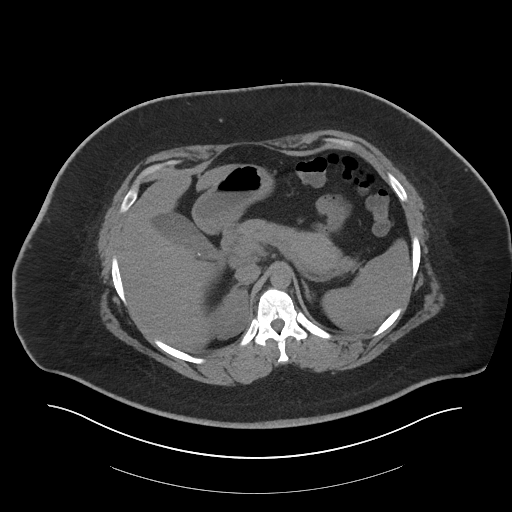
[im 90/110  soft-tissue]
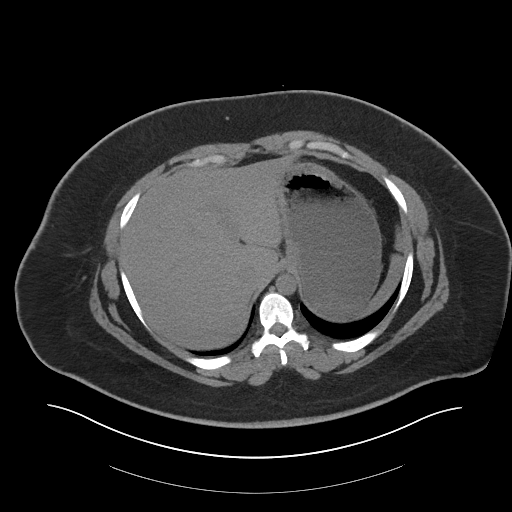
[im 95/110  soft-tissue]
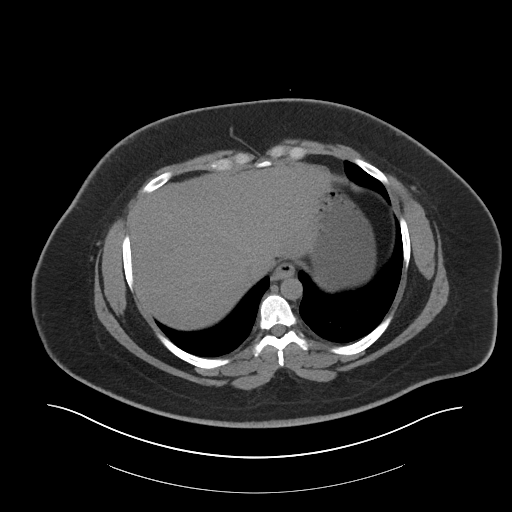
[im 105/110  soft-tissue]
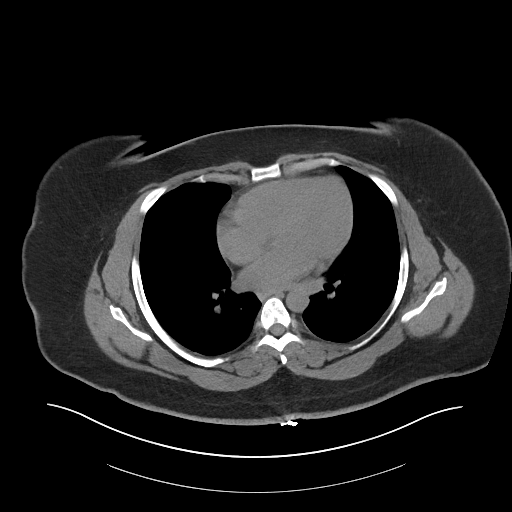

[Series 5: renal stone 3.0 cor · coronal · 0.96mm/px · 3 of 111 slices shown]
[im 37/111  soft-tissue]
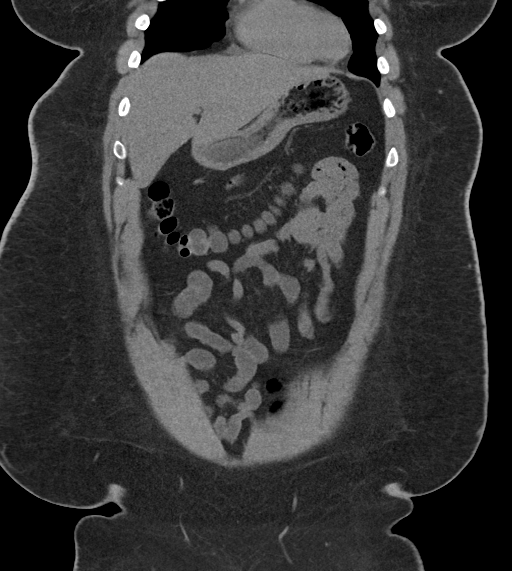
[im 49/111  soft-tissue]
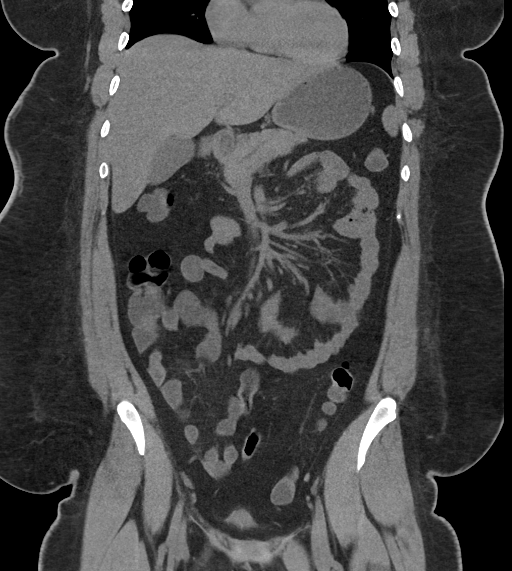
[im 62/111  soft-tissue]
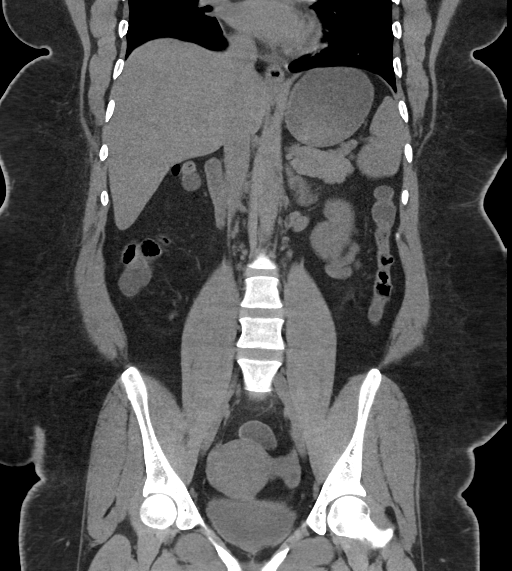

[17 of 46 positions shown; findings below may reference images not displayed]

FINDINGS: Lower chest and abdominal wall: Wide necked herniation of fat
through the umbilicus.

Hepatobiliary: No focal liver abnormality.Cholelithiasis without
obstructive or inflammatory change.

Pancreas: Unremarkable.

Spleen: Unremarkable.

Adrenals/Urinary Tract: Negative adrenals. Punctate bilateral
nephrolithiasis with 1-2 calculi on both sides. No hydronephrosis or
ureteral calculus. Unremarkable bladder.

Reproductive:No pathologic findings.

Stomach/Bowel: Fluid levels to the level the rectum. No inflammatory
bowel wall thickening. No obstruction. No appendicitis.

Vascular/Lymphatic: No acute vascular abnormality. No mass or
adenopathy.

Peritoneal: No ascites or pneumoperitoneum.

Musculoskeletal: No acute abnormalities.  Convincing osteitis ilii.
IMPRESSION: 1. Distal colonic fluid levels suggesting diarrheal illness.
2. Punctate bilateral renal calculi.
3. Cholelithiasis without acute cholecystitis.

## 2017-05-16 ENCOUNTER — Encounter (HOSPITAL_COMMUNITY): Payer: Self-pay | Admitting: Nurse Practitioner

## 2017-05-16 ENCOUNTER — Ambulatory Visit (HOSPITAL_COMMUNITY)
Admission: EM | Admit: 2017-05-16 | Discharge: 2017-05-16 | Disposition: A | Discharge status: Home / Self Care | Payer: Self-pay | Attending: Internal Medicine | Admitting: Internal Medicine

## 2017-05-16 DIAGNOSIS — S161XXA Strain of muscle, fascia and tendon at neck level, initial encounter: Secondary | ICD-10-CM

## 2017-05-16 DIAGNOSIS — J0140 Acute pansinusitis, unspecified: Secondary | ICD-10-CM

## 2017-05-16 DIAGNOSIS — R1084 Generalized abdominal pain: Secondary | ICD-10-CM

## 2017-05-16 HISTORY — DX: Gastro-esophageal reflux disease without esophagitis: K21.9

## 2017-05-16 MED ORDER — ONDANSETRON HCL 4 MG PO TABS: 4.0000 mg | ORAL_TABLET | Freq: Four times a day (QID) | ORAL | 0 refills | Status: DC

## 2017-05-16 MED ORDER — OMEPRAZOLE 20 MG PO CPDR: 20.0000 mg | DELAYED_RELEASE_CAPSULE | Freq: Every day | ORAL | 0 refills | Status: DC

## 2017-05-16 MED ORDER — NAPROXEN 500 MG PO TABS: 500.0000 mg | ORAL_TABLET | Freq: Two times a day (BID) | ORAL | 0 refills | Status: AC

## 2017-05-16 MED ORDER — FLUTICASONE PROPIONATE 50 MCG/ACT NA SUSP: 2.0000 | Freq: Every day | NASAL | 0 refills | Status: DC

## 2017-05-16 MED ORDER — CETIRIZINE-PSEUDOEPHEDRINE ER 5-120 MG PO TB12: 1.0000 | ORAL_TABLET | Freq: Every day | ORAL | 0 refills | Status: DC

## 2017-05-16 NOTE — ED Triage Notes (Addendum)
Pt presents with multiple complaints. She c/o headache, nasal congestion, rhinorrhea, sneezing, shortness of breath for the past several days since she was outside in the rain. She denies any fevers, cough, chest pain. She also c/o intermittent abdominal pain and nausea for the past year. She had an episode of vomiting and pain today. The symptoms started after she had her gallbladder removed. The pain feels like gas or pressure stuck in her abdomen.

## 2017-05-16 NOTE — Discharge Instructions (Signed)
Your headache could be due to sinus pressure as well as her neck strain. Takes Zyrtec-D, Flonase for nasal congestion. Naproxen 500 mg twice a day for muscle strain. Take omeprazole for acid reflex, and zofran for nausea/vomiting as needed. Follow-up with PCP for further evaluation of abdominal pain were follow-up with your GI specialist. Monitor for worsening of symptoms, fever, blood in stool or urine, nausea or vomiting not controlled with medicines, go to the ED for further evaluation.

## 2017-05-16 NOTE — ED Provider Notes (Signed)
CSN: [id number]     Arrival date & time 05/16/17  1012 History   None    Chief Complaint  Patient presents with  . Nasal Congestion  . Abdominal Pain   (Consider location/radiation/quality/duration/timing/severity/associated sxs/prior Treatment)  31 year old female with history of GERD, kidney stone, cholecystitis status post cholecystectomy comes in for multiple complaints.  1. She's been experiencing 5 day history of headache, nasal congestion, rhinorrhea, sneezing, shortness of breath after being in the rain. She denies fever, chills, night sweats. Denies cough, sore throat. Denies chest pain, palpitation, swelling of the leg. States headache is bilateral occipital region, describes it as a pressure. She also complains of frontal pressure that is different from her headaches. Denies photophobia, phonophobia. She does experience nausea and vomiting, but cannot associate that with headache or her abdominal complaint as will be described below. She has not tried anything.   2. She complains of intermittent abdominal pain for the past year after cholecystectomy. She states she feels like there is gas for pressure in her abdomen, she contacted her surgeon a month after the surgery, and was told that it could be gas that needs to be absorbed, and to wait a little longer. States it has since gotten worse. She has not found any pattern, no association to food, bowel movements, urinary symptoms. States it can come on anytime, where she has generalized abdominal pain, and feels that someone is scraping in her abdomen. She made sure she has normal bowel movements, and states that does not relieve any of her pain. Has found out aggravating factor or alleviating factor. It can last for a few minutes, and goes away on its own. Denies blood in the stool, blood in the urine.      Past Medical History:  Diagnosis Date  . Asthma   . Chlamydia    Pt reports treated 12/2012; no further contact with that partner   . Cholecystitis   . Dysrhythmia, cardiac   . GERD (gastroesophageal reflux disease)   . Kidney stone    Past Surgical History:  Procedure Laterality Date  . CHOLECYSTECTOMY    . KNEE SURGERY     Family History  Problem Relation Age of Onset  . Stroke Mother   . Gout Father    Social History  Substance Use Topics  . Smoking status: Never Smoker  . Smokeless tobacco: Never Used  . Alcohol use Yes   OB History    Gravida Para Term Preterm AB Living   2 2 2     2    SAB TAB Ectopic Multiple Live Births                 Review of Systems  Gastrointestinal: Positive for abdominal pain, nausea and vomiting. Negative for blood in stool, constipation and diarrhea.  Genitourinary: Negative for decreased urine volume, difficulty urinating, dysuria, frequency, hematuria, urgency, vaginal bleeding, vaginal discharge and vaginal pain.    Allergies  Patient has no known allergies.  Home Medications   Prior to Admission medications   Medication Sig Start Date End Date Taking? Authorizing Provider  cetirizine-pseudoephedrine (ZYRTEC-D) 5-120 MG tablet Take 1 tablet by mouth daily. 05/16/17   Cathie Hoops, Dreanna Kyllo V, PA-C  Etonogestrel (IMPLANON [place]) Inject 1 each into the skin once. Placed in 2011.    [provider]  fluticasone (FLONASE) 50 MCG/ACT nasal spray Place 2 sprays into both nostrils daily. 05/16/17   Cathie Hoops, [place] Skalicky V, PA-C  naproxen (NAPROSYN) 500 MG tablet Take 1 tablet (500  mg total) by mouth 2 (two) times daily. 05/16/17 05/26/17  Belinda FisherYu, Junious Ragone V, PA-C  omeprazole (PRILOSEC) 20 MG capsule Take 1 capsule (20 mg total) by mouth daily. 05/16/17   Cathie HoopsYu, Errin Whitelaw V, PA-C  ondansetron (ZOFRAN) 4 MG tablet Take 1 tablet (4 mg total) by mouth every 6 (six) hours. 05/16/17   Belinda FisherYu, Abb Gobert V, PA-C   Meds Ordered and Administered this Visit  Medications - No data to display  BP 120/65   Pulse 72   Temp 98.4 F (36.9 C) (Oral)   Resp 17   SpO2 94%  No data found.   Physical Exam  Constitutional: She is oriented  to person, place, and time. She appears well-developed and well-nourished. No distress.  HENT:  Head: Normocephalic and atraumatic.  Right Ear: Tympanic membrane, external ear and ear canal normal. Tympanic membrane is not erythematous and not bulging.  Left Ear: Tympanic membrane, external ear and ear canal normal. Tympanic membrane is not erythematous and not bulging.  Nose: Right sinus exhibits maxillary sinus tenderness and frontal sinus tenderness. Left sinus exhibits maxillary sinus tenderness and frontal sinus tenderness.  Mouth/Throat: Uvula is midline, oropharynx is clear and moist and mucous membranes are normal.  Eyes: Pupils are equal, round, and reactive to light. Conjunctivae and EOM are normal.  Neck: Normal range of motion. Neck supple. Muscular tenderness (bialteral) present. No spinous process tenderness present. Normal range of motion present.  Cardiovascular: Normal rate, regular rhythm and normal heart sounds.  Exam reveals no gallop and no friction rub.   No murmur heard. Pulmonary/Chest: Effort normal and breath sounds normal. She has no decreased breath sounds. She has no wheezes. She has no rhonchi. She has no rales.  Abdominal: Soft. Bowel sounds are normal. She exhibits distension. She exhibits no mass. There is tenderness (mild generalized tenderness). There is no rebound and no guarding.  Lymphadenopathy:    She has no cervical adenopathy.  Neurological: She is alert and oriented to person, place, and time.  Skin: Skin is warm and dry.  Psychiatric: She has a normal mood and affect. Her behavior is normal. Judgment normal.    Urgent Care Course     Procedures (including critical care time)  Labs Review Labs Reviewed - No data to display  Imaging Review No results found.      MDM   1. Acute non-recurrent pansinusitis   2. Strain of neck muscle, initial encounter   3. Generalized abdominal pain    Discussed with patient headache could be due to neck  strain as well as sinus pressure. Start flonase and zyrtec-d for nasal congestion. Naproxen for muscle strain. Discussed with patient possible causes of her abdominal pain, discussed that she will need further workup with PCP or GI specialist. Discussed with patient given history of GERD, and now with nausea, can treat with omeprazole, monitor for any improvements. Zofran for nausea and vomiting. Discussed with patient no alarming signs today. Follow up with PCP or GI for further workup of abdominal pain. Monitor for worsening of symptoms, fever, blood in stool or urine, nausea or vomiting not controlled with medicines, go to the ED for further evaluation.    Belinda FisherYu, Otoniel Myhand V, PA-C 05/16/17 1322

## 2017-12-02 ENCOUNTER — Emergency Department (HOSPITAL_COMMUNITY)
Admission: EM | Admit: 2017-12-02 | Discharge: 2017-12-02 | Disposition: A | Discharge status: Home / Self Care | Payer: Self-pay | Attending: Emergency Medicine | Admitting: Emergency Medicine

## 2017-12-02 ENCOUNTER — Encounter (HOSPITAL_COMMUNITY): Payer: Self-pay | Admitting: Emergency Medicine

## 2017-12-02 ENCOUNTER — Other Ambulatory Visit: Payer: Self-pay

## 2017-12-02 DIAGNOSIS — R110 Nausea: Secondary | ICD-10-CM | POA: Insufficient documentation

## 2017-12-02 DIAGNOSIS — N201 Calculus of ureter: Secondary | ICD-10-CM | POA: Insufficient documentation

## 2017-12-02 LAB — COMPREHENSIVE METABOLIC PANEL WITH GFR
ALT: 33 U/L (ref 14–54)
AST: 28 U/L (ref 15–41)
Albumin: 3.8 g/dL (ref 3.5–5.0)
Alkaline Phosphatase: 55 U/L (ref 38–126)
Anion gap: 10 (ref 5–15)
BUN: 17 mg/dL (ref 6–20)
CO2: 23 mmol/L (ref 22–32)
Calcium: 9.2 mg/dL (ref 8.9–10.3)
Chloride: 106 mmol/L (ref 101–111)
Creatinine, Ser: 1.17 mg/dL — ABNORMAL HIGH (ref 0.44–1.00)
GFR calc Af Amer: >60 mL/min
GFR calc non Af Amer: >60 mL/min
Glucose, Bld: 131 mg/dL — ABNORMAL HIGH (ref 65–99)
Potassium: 4.5 mmol/L (ref 3.5–5.1)
Sodium: 139 mmol/L (ref 135–145)
Total Bilirubin: 0.5 mg/dL (ref 0.3–1.2)
Total Protein: 7.6 g/dL (ref 6.5–8.1)

## 2017-12-02 LAB — URINALYSIS, ROUTINE W REFLEX MICROSCOPIC
Bacteria, UA: NONE SEEN
Bilirubin Urine: NEGATIVE
Glucose, UA: NEGATIVE mg/dL
Ketones, ur: NEGATIVE mg/dL
Leukocytes, UA: NEGATIVE
Nitrite: NEGATIVE
Protein, ur: NEGATIVE mg/dL
Specific Gravity, Urine: 1.014 (ref 1.005–1.030)
Squamous Epithelial / HPF: NONE SEEN
pH: 5 (ref 5.0–8.0)

## 2017-12-02 LAB — LIPASE, BLOOD: Lipase: 36 U/L (ref 11–51)

## 2017-12-02 LAB — CBC
HCT: 36.4 % (ref 36.0–46.0)
Hemoglobin: 12 g/dL (ref 12.0–15.0)
MCH: 29.9 pg (ref 26.0–34.0)
MCHC: 33 g/dL (ref 30.0–36.0)
MCV: 90.5 fL (ref 78.0–100.0)
Platelets: 279 10*3/uL (ref 150–400)
RBC: 4.02 MIL/uL (ref 3.87–5.11)
RDW: 13.6 % (ref 11.5–15.5)
WBC: 6.9 10*3/uL (ref 4.0–10.5)

## 2017-12-02 LAB — I-STAT BETA HCG BLOOD, ED (MC, WL, AP ONLY): I-stat hCG, quantitative: <5 m[IU]/mL

## 2017-12-02 MED ORDER — ONDANSETRON 8 MG PO TBDP: 8.0000 mg | ORAL_TABLET | Freq: Three times a day (TID) | ORAL | 1 refills | Status: DC | PRN

## 2017-12-02 MED ORDER — ONDANSETRON HCL 4 MG/2ML IJ SOLN
4.0000 mg | Freq: Once | INTRAMUSCULAR | Status: AC
  Administered 2017-12-02: 4 mg via INTRAVENOUS
  Filled 2017-12-02: qty 2

## 2017-12-02 MED ORDER — SODIUM CHLORIDE 0.9 % IV BOLUS (SEPSIS)
1000.0000 mL | Freq: Once | INTRAVENOUS | Status: AC
  Administered 2017-12-02: 1000 mL via INTRAVENOUS

## 2017-12-02 MED ORDER — OXYCODONE-ACETAMINOPHEN 5-325 MG PO TABS: 1.0000 | ORAL_TABLET | ORAL | 0 refills | Status: DC | PRN

## 2017-12-02 MED ORDER — HYDROMORPHONE HCL 1 MG/ML IJ SOLN
1.0000 mg | Freq: Once | INTRAMUSCULAR | Status: AC
  Administered 2017-12-02: 1 mg via INTRAVENOUS
  Filled 2017-12-02: qty 1

## 2017-12-02 MED ORDER — KETOROLAC TROMETHAMINE 15 MG/ML IJ SOLN
15.0000 mg | Freq: Once | INTRAMUSCULAR | Status: AC
  Administered 2017-12-02: 15 mg via INTRAVENOUS
  Filled 2017-12-02: qty 1

## 2017-12-02 NOTE — ED Triage Notes (Signed)
[facility] with complaints of severe right flank pain 10/10-associated with nausea and vomiting-patient has hx kidney stones-given Zofran 4 mg IV-hx kidney stones in the past.

## 2017-12-02 NOTE — ED Notes (Signed)
PIV attempt and US IV attempt unsuccessful

## 2017-12-02 NOTE — ED Provider Notes (Signed)
[facility]-EMERGENCY DEPT Provider Note: Lowella Dell, MD, FACEP  CSN: [id number] MRN: [id number] ARRIVAL: 12/02/17 at 0325 ROOM: WA18/WA18   CHIEF COMPLAINT  Right flank pain   HISTORY OF PRESENT ILLNESS  12/02/17 5:24 AM Linda Chen is a 32 y.o. female with a history of kidney stones.  She is here with right-sided flank pain that awakened her from sleep this morning.  She rated her pain as a 10 out of 10 and characterized it as like previous kidney stones.  There was associated nausea but no vomiting.  While in the emergency department she passed a small stone with reduction in her pain from a 10 out of 10 to an 8 out of 10.  Consultation with the [facility] state controlled substances database reveals the patient has received no opioid prescriptions in the past two years.   Past Medical History:  Diagnosis Date  . Asthma   . Chlamydia    Pt reports treated 12/2012; no further contact with that partner  . Cholecystitis   . Dysrhythmia, cardiac   . GERD (gastroesophageal reflux disease)   . Kidney stone     Past Surgical History:  Procedure Laterality Date  . CHOLECYSTECTOMY    . KNEE SURGERY      Family History  Problem Relation Age of Onset  . Stroke Mother   . Gout Father     Social History   Tobacco Use  . Smoking status: Never Smoker  . Smokeless tobacco: Never Used  Substance Use Topics  . Alcohol use: Yes  . Drug use: No    Prior to Admission medications   Medication Sig Start Date End Date Taking? Authorizing Provider  Etonogestrel (IMPLANON [place]) Inject 1 each into the skin once. Placed in 04/16/2016   Yes [provider]  cetirizine-pseudoephedrine (ZYRTEC-D) 5-120 MG tablet Take 1 tablet by mouth daily. Patient not taking: Reported on 12/02/2017 05/16/17   Belinda Fisher, PA-C  fluticasone [facility]) 50 MCG/ACT nasal spray Place 2 sprays into both nostrils daily. Patient not taking: Reported on 12/02/2017 05/16/17   Belinda Fisher, PA-C  omeprazole  (PRILOSEC) 20 MG capsule Take 1 capsule (20 mg total) by mouth daily. Patient not taking: Reported on 12/02/2017 05/16/17   Belinda Fisher, PA-C  ondansetron (ZOFRAN) 4 MG tablet Take 1 tablet (4 mg total) by mouth every 6 (six) hours. Patient not taking: Reported on 12/02/2017 05/16/17   Belinda Fisher, PA-C    Allergies Patient has no known allergies.   REVIEW OF SYSTEMS  Negative except as noted here or in the History of Present Illness.   PHYSICAL EXAMINATION  Initial Vital Signs Blood pressure (!) 113/54, pulse 72, resp. rate 19, SpO2 97 %.  Examination General: Well-developed, obese female in no acute distress; appearance consistent with age of record HENT: normocephalic; atraumatic Eyes: pupils equal, round and reactive to light; extraocular muscles intact Neck: supple Heart: regular rate and rhythm Lungs: clear to auscultation bilaterally Abdomen: soft; obese; nontender; bowel sounds present Extremities: No deformity; full range of motion; pulses normal Neurologic: Awake, alert and oriented; motor function intact in all extremities and symmetric; no facial droop Skin: Warm and dry Psychiatric: Normal mood and affect   RESULTS  Summary of this visit's results, reviewed by myself:   EKG Interpretation  Date/Time:    Ventricular Rate:    PR Interval:    QRS Duration:   QT Interval:    QTC Calculation:   R Axis:  Text Interpretation:        Laboratory Studies: Results for orders placed or performed during the hospital encounter of 12/02/17 (from the past 24 hour(s))  CBC     Status: None   Collection Time: 12/02/17  4:04 AM  Result Value Ref Range   WBC 6.9 4.0 - 10.5 K/uL   RBC 4.02 3.87 - 5.11 MIL/uL   Hemoglobin 12.0 12.0 - 15.0 g/dL   HCT 16.136.4 09.636.0 - 04.546.0 %   MCV 90.5 78.0 - 100.0 fL   MCH 29.9 26.0 - 34.0 pg   MCHC 33.0 30.0 - 36.0 g/dL   RDW 40.913.6 81.111.5 - 91.415.5 %   Platelets 279 150 - 400 K/uL  I-Stat beta hCG blood, ED     Status: None   Collection Time:  12/02/17  4:17 AM  Result Value Ref Range   I-stat hCG, quantitative <5.0 <5 mIU/mL   Comment 3          Comprehensive metabolic panel     Status: Abnormal   Collection Time: 12/02/17  5:00 AM  Result Value Ref Range   Sodium 139 135 - 145 mmol/L   Potassium 4.5 3.5 - 5.1 mmol/L   Chloride 106 101 - 111 mmol/L   CO2 23 22 - 32 mmol/L   Glucose, Bld 131 (H) 65 - 99 mg/dL   BUN 17 6 - 20 mg/dL   Creatinine, Ser 7.821.17 (H) 0.44 - 1.00 mg/dL   Calcium 9.2 8.9 - 95.610.3 mg/dL   Total Protein 7.6 6.5 - 8.1 g/dL   Albumin 3.8 3.5 - 5.0 g/dL   AST 28 15 - 41 U/L   ALT 33 14 - 54 U/L   Alkaline Phosphatase 55 38 - 126 U/L   Total Bilirubin 0.5 0.3 - 1.2 mg/dL   GFR calc non Af Amer >60 >60 mL/min   GFR calc Af Amer >60 >60 mL/min   Anion gap 10 5 - 15  Lipase, blood     Status: None   Collection Time: 12/02/17  5:00 AM  Result Value Ref Range   Lipase 36 11 - 51 U/L  Urinalysis, Routine w reflex microscopic- may I&O cath if menses     Status: Abnormal   Collection Time: 12/02/17  5:12 AM  Result Value Ref Range   Color, Urine YELLOW YELLOW   APPearance CLEAR CLEAR   Specific Gravity, Urine 1.014 1.005 - 1.030   pH 5.0 5.0 - 8.0   Glucose, UA NEGATIVE NEGATIVE mg/dL   Hgb urine dipstick LARGE (A) NEGATIVE   Bilirubin Urine NEGATIVE NEGATIVE   Ketones, ur NEGATIVE NEGATIVE mg/dL   Protein, ur NEGATIVE NEGATIVE mg/dL   Nitrite NEGATIVE NEGATIVE   Leukocytes, UA NEGATIVE NEGATIVE   RBC / HPF 6-30 0 - 5 RBC/hpf   WBC, UA 0-5 0 - 5 WBC/hpf   Bacteria, UA NONE SEEN NONE SEEN   Squamous Epithelial / LPF NONE SEEN NONE SEEN   Mucus PRESENT    Hyaline Casts, UA PRESENT    Imaging Studies: No results found.  ED COURSE  Nursing notes and initial vitals signs, including pulse oximetry, reviewed.  Vitals:   12/02/17 0326 12/02/17 0650  BP:  (!) 113/54  Pulse:  72  Resp:  19  SpO2: 98% 97%   7:05 AM Patient's pain has persisted despite passing one stone.  She states she feels as if  she has another stone yet to pass.  As we wish to avoid unnecessary CT scans  we will treat presumptively for passage of a second stone and refer her to urology if symptoms do not resolve.  PROCEDURES    ED DIAGNOSES     ICD-10-CM   1. Ureterolithiasis N20.1        Cindel Daugherty, MD 12/02/17 [id number]

## 2017-12-02 NOTE — ED Triage Notes (Signed)
Patient complaining of severe right abdominal pain-no CVA tenderness

## 2017-12-06 LAB — STONE ANALYSIS
Ca Oxalate,Monohydr.: 98 %
Ca phos cry stone ql IR: 2 %
Stone Weight KSTONE: 5.9 mg

## 2018-09-29 ENCOUNTER — Encounter (HOSPITAL_COMMUNITY): Payer: Self-pay | Admitting: *Deleted

## 2018-09-29 ENCOUNTER — Emergency Department (HOSPITAL_COMMUNITY): Payer: Self-pay

## 2018-09-29 ENCOUNTER — Emergency Department (HOSPITAL_COMMUNITY)
Admission: EM | Admit: 2018-09-29 | Discharge: 2018-09-29 | Disposition: A | Discharge status: Home / Self Care | Payer: Self-pay | Attending: Emergency Medicine | Admitting: Emergency Medicine

## 2018-09-29 DIAGNOSIS — R0982 Postnasal drip: Secondary | ICD-10-CM | POA: Insufficient documentation

## 2018-09-29 DIAGNOSIS — B9789 Other viral agents as the cause of diseases classified elsewhere: Secondary | ICD-10-CM

## 2018-09-29 DIAGNOSIS — J069 Acute upper respiratory infection, unspecified: Secondary | ICD-10-CM | POA: Insufficient documentation

## 2018-09-29 DIAGNOSIS — J0100 Acute maxillary sinusitis, unspecified: Secondary | ICD-10-CM | POA: Insufficient documentation

## 2018-09-29 MED ORDER — ALBUTEROL SULFATE HFA 108 (90 BASE) MCG/ACT IN AERS: 1.0000 | INHALATION_SPRAY | Freq: Four times a day (QID) | RESPIRATORY_TRACT | 0 refills | Status: DC | PRN

## 2018-09-29 MED ORDER — PREDNISONE 10 MG PO TABS: 40.0000 mg | ORAL_TABLET | Freq: Every day | ORAL | 0 refills | Status: AC

## 2018-09-29 MED ORDER — PREDNISONE 10 MG PO TABS: 40.0000 mg | ORAL_TABLET | Freq: Every day | ORAL | 0 refills | Status: DC

## 2018-09-29 NOTE — ED Provider Notes (Signed)
[facility] EMERGENCY DEPARTMENT Provider Note   CSN: [id number] Arrival date & time: 09/29/18  7425     History   Chief Complaint Chief Complaint  Patient presents with  . URI    HPI Linda Chen is a 32 y.o. female with history of asthma is here for evaluation of cough.  Onset 4 days ago.  Gradually worsening, productive of clear sputum.  Associated with fever up to 102, chills, nasal congestion, postnasal drip, facial pressure, irritated throat and pain in her throat and chest when she is coughing.  No interventions for this.  No alleviating or aggravating factors.  Her children had a fever but no other symptoms.  She denies any nausea, vomiting, abdominal pain, diarrhea.  HPI  Past Medical History:  Diagnosis Date  . Asthma   . Chlamydia    Pt reports treated 12/2012; no further contact with that partner  . Cholecystitis   . Dysrhythmia, cardiac   . GERD (gastroesophageal reflux disease)   . Kidney stone     Patient Active Problem List   Diagnosis Date Noted  . Screening examination for STD (sexually transmitted disease) 09/13/2013  . Vaginitis 09/13/2013    Past Surgical History:  Procedure Laterality Date  . CHOLECYSTECTOMY    . KNEE SURGERY       OB History    Gravida  2   Para  2   Term  2   Preterm      AB      Living  2     SAB      TAB      Ectopic      Multiple      Live Births               Home Medications    Prior to Admission medications   Medication Sig Start Date End Date Taking? Authorizing Provider  albuterol (PROVENTIL HFA;VENTOLIN HFA) 108 (90 Base) MCG/ACT inhaler Inhale 1-2 puffs into the lungs every 6 (six) hours as needed for wheezing or shortness of breath. 09/29/18   Liberty Handy, PA-C  Etonogestrel (IMPLANON [place]) Inject 1 each into the skin once. Placed in 04/16/2016    [provider]  ondansetron (ZOFRAN ODT) 8 MG disintegrating tablet Take 1 tablet (8 mg total) by mouth  every 8 (eight) hours as needed for nausea or vomiting. 12/02/17   Molpus, Jonny Ruiz, MD  oxyCODONE-acetaminophen (PERCOCET) 5-325 MG tablet Take 1-2 tablets by mouth every 4 (four) hours as needed for severe pain. 12/02/17   Molpus, John, MD  predniSONE (DELTASONE) 10 MG tablet Take 4 tablets (40 mg total) by mouth daily for 5 days. 09/29/18 10/04/18  Liberty Handy, PA-C    Family History Family History  Problem Relation Age of Onset  . Stroke Mother   . Gout Father     Social History Social History   Tobacco Use  . Smoking status: Never Smoker  . Smokeless tobacco: Never Used  Substance Use Topics  . Alcohol use: Yes  . Drug use: No     Allergies   Patient has no known allergies.   Review of Systems Review of Systems  Constitutional: Positive for fever.  HENT: Positive for congestion, postnasal drip and sore throat.   Respiratory: Positive for cough.   All other systems reviewed and are negative.    Physical Exam Updated Vital Signs BP (!) 144/86 (BP Location: Right Arm)   Pulse 86   Temp 98.7  F (37.1 C) (Oral)   Resp 18   SpO2 100%   Physical Exam Vitals signs and nursing note reviewed.  Constitutional:      General: She is not in acute distress.    Appearance: She is well-developed.     Comments: Sounds congested.  HENT:     Head: Normocephalic and atraumatic.     Right Ear: External ear normal.     Left Ear: External ear normal.     Nose: Nose normal.     Comments: Market mucosal edema and erythema bilaterally.  No rhinorrhea.  Diffuse sinus tenderness to maxillary and frontal sinuses.    Mouth/Throat:     Comments: No erythema, edema to oropharynx and tonsils.  Uvula midline. Eyes:     General: No scleral icterus.    Conjunctiva/sclera: Conjunctivae normal.  Neck:     Musculoskeletal: Normal range of motion and neck supple.  Cardiovascular:     Rate and Rhythm: Normal rate and regular rhythm.     Heart sounds: Normal heart sounds. No murmur.    Pulmonary:     Effort: Pulmonary effort is normal.     Breath sounds: No wheezing.     Comments: Normal work of breathing.  Diminished lung sounds to lower lobes bilaterally, limited by body habitus.  No wheezing or crackles.  Frequent coughing throughout exam. Musculoskeletal: Normal range of motion.        General: No deformity.  Skin:    General: Skin is warm and dry.     Capillary Refill: Capillary refill takes less than 2 seconds.  Neurological:     Mental Status: She is alert and oriented to person, place, and time.  Psychiatric:        Behavior: Behavior normal.        Thought Content: Thought content normal.        Judgment: Judgment normal.      ED Treatments / Results  Labs (all labs ordered are listed, but only abnormal results are displayed) Labs Reviewed - No data to display  EKG None  Radiology Dg Chest 2 View  Result Date: 09/29/2018 CLINICAL DATA:  Cough, fever EXAM: CHEST - 2 VIEW COMPARISON:  04/01/2006 FINDINGS: Heart and mediastinal contours are within normal limits. No focal opacities or effusions. No acute bony abnormality. IMPRESSION: No active cardiopulmonary disease. Electronically Signed   By: Charlett NoseKevin  Dover M.D.   On: 09/29/2018 10:48    Procedures Procedures (including critical care time)  Medications Ordered in ED Medications - No data to display   Initial Impression / Assessment and Plan / ED Course  I have reviewed the triage vital signs and the nursing notes.  Pertinent labs & imaging results that were available during my care of the patient were reviewed by me and considered in my medical decision making (see chart for details).     Patient presents with URI symptoms for 4 days.  Known sick contacts.  On exam she has no fever, tachycardia, tachypnea or hypoxia.  No wheezing noted.  I suspect viral process versus early asthma flare.  Chest x-ray today at triage is negative.  We will discharge with symptomatic management, albuterol and  prednisone to help with early asthma exacerbation.  Patient is aware of s/s that would warrant return to ED for further reevaluation. Pt ambulated with pulse ox within normal limits prior to discharge.    Final Clinical Impressions(s) / ED Diagnoses   Final diagnoses:  Viral URI with cough  Acute  non-recurrent maxillary sinusitis    ED Discharge Orders         Ordered    albuterol (PROVENTIL HFA;VENTOLIN HFA) 108 (90 Base) MCG/ACT inhaler  Every 6 hours PRN     09/29/18 1152    predniSONE (DELTASONE) 10 MG tablet  Daily     09/29/18 1152           Jerrell MylarGibbons, Daveion Robar J, PA-C 09/29/18 1159    Gerhard MunchLockwood, Robert, MD 10/01/18 0020

## 2018-09-29 NOTE — ED Notes (Signed)
ED Provider at bedside. 

## 2018-09-29 NOTE — ED Triage Notes (Signed)
Pt in c/o cough and congestion, sore throat and intermittent fever for the last week, pt does have history of asthma, no distress noted on arrival, reports she does not have an inhaler currently but has felt wheezy at times

## 2018-09-29 NOTE — Discharge Instructions (Signed)
Your symptoms are most likely from a virus that has caused an upper respiratory infection. Possible influenza. A viral illness typically peaks on day 2-3 and resolves after one week.     The main treatment approach for a viral upper respiratory infection is to treat the symptoms, support your immune system and prevent spread of illness.    Stay well-hydrated. Rest. You can use over the counter medications to help with symptoms: 600 mg ibuprofen (motrin, aleve, advil) or acetaminophen (tylenol) every 6 hours, around the clock to help with associated fevers, sore throat, headaches, generalized body aches and malaise.  Oxymetazoline (afrin) intranasal spray once daily for no more than 3 days to help with congestion, after 3 days you can switch to another over-the-counter nasal steroid spray such as fluticasone (flonase) Allergy medication (loratadine, cetirizine, etc) and phenylephrine (sudafed) help with nasal congestion, runny nose and postnasal drip.   Dextromethorphan (Delsym) to suppress dry cough  Guaifenesin (mucinex) to help with built up mucus in chest and productive cough Wash your hands often to prevent spread.  Ondansatron (Zofran) for nausea  Albuterol inhaler and prednisone will help with possible associated asthma flare from virus    A viral upper respiratory infection can also worsen and progress into pneumonia.  Monitor your symptoms. Return for persistent fevers, chest pain, productive cough, inability to tolerate fluids despite nausea medicines or dehydration

## 2018-09-29 NOTE — ED Notes (Signed)
Patient verbalized understanding of dc instructions, vss, ambulatory with nad.   

## 2018-11-11 ENCOUNTER — Other Ambulatory Visit: Payer: Self-pay

## 2018-11-11 ENCOUNTER — Encounter (HOSPITAL_COMMUNITY): Payer: Self-pay | Admitting: *Deleted

## 2018-11-11 ENCOUNTER — Emergency Department (HOSPITAL_COMMUNITY)
Admission: EM | Admit: 2018-11-11 | Discharge: 2018-11-11 | Disposition: A | Discharge status: Home / Self Care | Payer: Medicaid Other | Attending: Emergency Medicine | Admitting: Emergency Medicine

## 2018-11-11 DIAGNOSIS — R51 Headache: Secondary | ICD-10-CM | POA: Insufficient documentation

## 2018-11-11 DIAGNOSIS — J111 Influenza due to unidentified influenza virus with other respiratory manifestations: Secondary | ICD-10-CM | POA: Insufficient documentation

## 2018-11-11 DIAGNOSIS — R69 Illness, unspecified: Secondary | ICD-10-CM

## 2018-11-11 DIAGNOSIS — R0981 Nasal congestion: Secondary | ICD-10-CM | POA: Diagnosis not present

## 2018-11-11 DIAGNOSIS — R509 Fever, unspecified: Secondary | ICD-10-CM | POA: Diagnosis present

## 2018-11-11 MED ORDER — FLUTICASONE PROPIONATE 50 MCG/ACT NA SUSP: 1.0000 | Freq: Every day | NASAL | 0 refills | Status: DC

## 2018-11-11 NOTE — ED Notes (Signed)
Declined W/C at D/C and was escorted to lobby by RN. 

## 2018-11-11 NOTE — ED Provider Notes (Signed)
[facility] EMERGENCY DEPARTMENT Provider Note   CSN: [id number] Arrival date & time: 11/11/18  0754     History   Chief Complaint Chief Complaint  Patient presents with  . URI    HPI Linda Chen is a 33 y.o. female.  HPI  Patient is a 33 year old female with a history of asthma presenting for fever earlier in the week, congestion, rhinorrhea, and "head pressure".  Patient reports that her symptoms began 4 to 5 days ago.  She reports that 4 days ago she began having a frontal headache, however it is improving.  She reports that her throat feels "congested" but is not painful.  Denies any difficulty breathing or difficulty swallowing.  Denies cough.  Reports congestion and rhinorrhea.  Patient reports that she took Synex for her symptoms and that appeared to break her fever.  No recent known sick contacts.  No history of immune compromise status.  No recent travel.   Past Medical History:  Diagnosis Date  . Asthma   . Chlamydia    Pt reports treated 12/2012; no further contact with that partner  . Cholecystitis   . Dysrhythmia, cardiac   . GERD (gastroesophageal reflux disease)   . Kidney stone     Patient Active Problem List   Diagnosis Date Noted  . Screening examination for STD (sexually transmitted disease) 09/13/2013  . Vaginitis 09/13/2013    Past Surgical History:  Procedure Laterality Date  . CHOLECYSTECTOMY    . KNEE SURGERY       OB History    Gravida  2   Para  2   Term  2   Preterm      AB      Living  2     SAB      TAB      Ectopic      Multiple      Live Births               Home Medications    Prior to Admission medications   Medication Sig Start Date End Date Taking? Authorizing Provider  albuterol (PROVENTIL HFA;VENTOLIN HFA) 108 (90 Base) MCG/ACT inhaler Inhale 1-2 puffs into the lungs every 6 (six) hours as needed for wheezing or shortness of breath. 09/29/18   Liberty Handy, PA-C    Etonogestrel (IMPLANON [place]) Inject 1 each into the skin once. Placed in 04/16/2016    [provider]  fluticasone (FLONASE) 50 MCG/ACT nasal spray Place 1 spray into both nostrils daily. 11/11/18   Aviva Kluver B, PA-C  ondansetron (ZOFRAN ODT) 8 MG disintegrating tablet Take 1 tablet (8 mg total) by mouth every 8 (eight) hours as needed for nausea or vomiting. 12/02/17   Molpus, Jonny Ruiz, MD  oxyCODONE-acetaminophen (PERCOCET) 5-325 MG tablet Take 1-2 tablets by mouth every 4 (four) hours as needed for severe pain. 12/02/17   Molpus, John, MD    Family History Family History  Problem Relation Age of Onset  . Stroke Mother   . Gout Father     Social History Social History   Tobacco Use  . Smoking status: Never Smoker  . Smokeless tobacco: Never Used  Substance Use Topics  . Alcohol use: Yes  . Drug use: No     Allergies   Other   Review of Systems Review of Systems  Constitutional: Negative for chills and fever.  HENT: Positive for congestion and rhinorrhea. Negative for ear pain, sore throat, trouble swallowing and voice  change.   Respiratory: Negative for shortness of breath.   Gastrointestinal: Negative for abdominal pain, nausea and vomiting.  Neurological: Negative for headaches.     Physical Exam Updated Vital Signs BP 116/87 (BP Location: Right Arm)   Pulse 79   Temp 97.8 F (36.6 C) (Oral)   Resp 16   Ht 5\' 11"  (1.803 m)   Wt (!) 145.2 kg   LMP 11/07/2018   SpO2 100%   BMI 44.63 kg/m   Physical Exam Vitals signs and nursing note reviewed.  Constitutional:      General: She is not in acute distress.    Appearance: She is well-developed. She is not diaphoretic.     Comments: Sitting comfortably in bed.  HENT:     Head: Normocephalic and atraumatic.     Comments: Normal phonation. No muffled voice sounds. Patient swallows secretions without difficulty. Dentition normal. No lesions of tongue or buccal mucosa. Uvula midline. No asymmetric swelling of  the posterior pharynx.No erythema of posterior pharynx. No tonsillar exuduate. No lingual swelling. No induration inferior to tongue. No submandibular tenderness, swelling, or induration.  Tissues of the neck supple. No cervical lymphadenopathy. Right TM without erythema or effusion; left TM without erythema or effusion.     Right Ear: Tympanic membrane normal.     Left Ear: Tympanic membrane normal.  Eyes:     General:        Right eye: No discharge.        Left eye: No discharge.     Conjunctiva/sclera: Conjunctivae normal.     Comments: EOMs normal to gross examination.  Neck:     Musculoskeletal: Normal range of motion.  Cardiovascular:     Rate and Rhythm: Normal rate and regular rhythm.     Heart sounds: Normal heart sounds.     Comments: Intact, 2+ radial pulse. Pulmonary:     Effort: Pulmonary effort is normal.     Breath sounds: Normal breath sounds. No wheezing or rales.  Abdominal:     General: There is no distension.  Musculoskeletal: Normal range of motion.  Skin:    General: Skin is warm and dry.  Neurological:     Mental Status: She is alert.     Comments: Cranial nerves intact to gross observation. Patient moves extremities without difficulty.  Psychiatric:        Behavior: Behavior normal.        Thought Content: Thought content normal.        Judgment: Judgment normal.      ED Treatments / Results  Labs (all labs ordered are listed, but only abnormal results are displayed) Labs Reviewed - No data to display  EKG None  Radiology No results found.  Procedures Procedures (including critical care time)  Medications Ordered in ED Medications - No data to display   Initial Impression / Assessment and Plan / ED Course  I have reviewed the triage vital signs and the nursing notes.  Pertinent labs & imaging results that were available during my care of the patient were reviewed by me and considered in my medical decision making (see chart for  details).     Patient with symptoms consistent with a viral syndrome.  I discussed with the patient that she may have influenza given the fevers earlier in the week, myalgias, and head pressure and headache that are all resolving.  I did discuss with the patient that we would not test at this point given that this is the  fifth day of her illness.  Vitals are stable, no fever. No signs of dehydration. Lung exam normal, no signs of pneumonia.  Patient's primary concern was the congestion.  I discussed with the patient to discontinue Afrin and use Flonase instead.  Supportive therapy indicated with return if symptoms worsen.  Patient is in understanding and agrees with plan of care.  Final Clinical Impressions(s) / ED Diagnoses   Final diagnoses:  Influenza-like illness    ED Discharge Orders         Ordered    fluticasone (FLONASE) 50 MCG/ACT nasal spray  Daily     11/11/18 0828           Elisha PonderMurray, Caris Cerveny B, PA-C 11/11/18 1638    Mancel BaleWentz, Elliott, MD 11/11/18 1919

## 2018-11-11 NOTE — ED Triage Notes (Signed)
Pt reports HA , sore throat and a fever sometimes. Pt does not have fever this AM.

## 2018-11-11 NOTE — Discharge Instructions (Signed)
Please read and follow all provided instructions.  Your diagnoses today include:  1. Influenza-like illness     You appear to have an upper respiratory infection (URI). An upper respiratory tract infection, or cold, is a viral infection of the air passages leading to the lungs. It should improve gradually after 5-7 days. You may have a lingering cough that lasts for 2- 4 weeks after the infection.  Tests performed today include: Vital signs. See below for your results today.   Medications prescribed:   Take any prescribed medications only as directed. Treatment for your infection is aimed at treating the symptoms. There are no medications, such as antibiotics, that will cure your infection.   Please place 1 spray of the Flonase in each nostril once daily in the morning.  I recommend ibuprofen, a non-steroidal anti-inflammatory agent (NSAID) for pain. You may take 600 mg every 6 hours as needed for pain. If still requiring this medication around the clock for acute pain after 10 days, please see your primary healthcare provider.  Women who are pregnant, breastfeeding, or planning on becoming pregnant should not take non-steroidal anti-inflammatories such as Advil and Aleve. Tylenol is a safe over the counter pain reliever in pregnant women.  You may combine this medication with Tylenol, 650 mg every 6 hours, so you are receiving something for pain every 3 hours.  This is not a long-term medication unless under the care and direction of your primary provider. Taking this medication long-term and not under the supervision of a healthcare provider could increase the risk of stomach ulcers, kidney problems, and cardiovascular problems such as high blood pressure.    Home care instructions:  Follow any educational materials contained in this packet.   Your illness is contagious and can be spread to others, especially during the first 3 or 4 days. It cannot be cured by antibiotics or other  medicines. Take basic precautions such as washing your hands often, covering your mouth when you cough or sneeze, and avoiding public places where you could spread your illness to others.   Please continue drinking plenty of fluids.  Use over-the-counter medicines as needed as directed on packaging for symptom relief.  You may also use ibuprofen or tylenol as directed on packaging for pain or fever.  Do not take multiple medicines containing Tylenol or acetaminophen to avoid taking too much of this medication.  Please return to work on the left you have been fever free for 24 hours not requiring Tylenol (acetaminophen), or Advil (ibuprofen).  Follow-up instructions: Please follow-up with your primary care provider in the next 3 days for further evaluation of your symptoms if you are not feeling better.   Return instructions:  Please return to the Emergency Department if you experience worsening symptoms.  RETURN IMMEDIATELY IF you develop shortness of breath, chest pain, confusion or altered mental status, a new rash, become dizzy, faint, or poorly responsive, or are unable to be cared for at home. Please return if you have persistent vomiting and cannot keep down fluids or develop a fever that is not controlled by tylenol or motrin.   Please return if you have any other emergent concerns.  Additional Information:  Your vital signs today were: BP 116/87 (BP Location: Right Arm)    Pulse 79    Temp 97.8 F (36.6 C) (Oral)    Resp 16    Ht 5\' 11"  (1.803 m)    Wt (!) 145.2 kg    LMP 11/07/2018  SpO2 100%    BMI 44.63 kg/m  If your blood pressure (BP) was elevated above 135/85 this visit, please have this repeated by your doctor within one month. --------------

## 2018-12-09 ENCOUNTER — Other Ambulatory Visit: Payer: Self-pay

## 2018-12-09 ENCOUNTER — Emergency Department (HOSPITAL_BASED_OUTPATIENT_CLINIC_OR_DEPARTMENT_OTHER)
Admission: EM | Admit: 2018-12-09 | Discharge: 2018-12-09 | Disposition: A | Discharge status: Home / Self Care | Payer: Medicaid Other | Attending: Emergency Medicine | Admitting: Emergency Medicine

## 2018-12-09 ENCOUNTER — Encounter (HOSPITAL_BASED_OUTPATIENT_CLINIC_OR_DEPARTMENT_OTHER): Payer: Self-pay | Admitting: *Deleted

## 2018-12-09 DIAGNOSIS — Z79899 Other long term (current) drug therapy: Secondary | ICD-10-CM | POA: Diagnosis not present

## 2018-12-09 DIAGNOSIS — J45909 Unspecified asthma, uncomplicated: Secondary | ICD-10-CM | POA: Diagnosis not present

## 2018-12-09 DIAGNOSIS — K029 Dental caries, unspecified: Secondary | ICD-10-CM | POA: Diagnosis not present

## 2018-12-09 DIAGNOSIS — K0889 Other specified disorders of teeth and supporting structures: Secondary | ICD-10-CM | POA: Diagnosis present

## 2018-12-09 MED ORDER — ACETAMINOPHEN 500 MG PO TABS: 500.0000 mg | ORAL_TABLET | Freq: Four times a day (QID) | ORAL | 0 refills | Status: DC | PRN

## 2018-12-09 MED ORDER — PENICILLIN V POTASSIUM 500 MG PO TABS: 1000.0000 mg | ORAL_TABLET | Freq: Two times a day (BID) | ORAL | 0 refills | Status: DC

## 2018-12-09 MED ORDER — BUPIVACAINE-EPINEPHRINE (PF) 0.5% -1:200000 IJ SOLN
1.8000 mL | Freq: Once | INTRAMUSCULAR | Status: DC
  Filled 2018-12-09: qty 1.8

## 2018-12-09 MED ORDER — NAPROXEN 375 MG PO TABS: 375.0000 mg | ORAL_TABLET | Freq: Two times a day (BID) | ORAL | 0 refills | Status: DC

## 2018-12-09 MED ORDER — BUPIVACAINE-EPINEPHRINE (PF) 0.5% -1:200000 IJ SOLN
INTRAMUSCULAR | Status: AC
  Filled 2018-12-09: qty 1.8

## 2018-12-09 NOTE — ED Triage Notes (Signed)
Pt with right and left lower dental pain for about three weeks.

## 2018-12-09 NOTE — ED Provider Notes (Signed)
[facility] EMERGENCY DEPARTMENT Provider Note   CSN: [id number] Arrival date & time: 12/09/18  0631    History   Chief Complaint Chief Complaint  Patient presents with  . Dental Pain    HPI Linda Chen is a 33 y.o. female.     HPI  33 year old female with bilateral dental pain.  It is mandibular.  Has been ongoing for "a while" but much worse over the last several weeks.  No drainage but left is hurting worse than right.  No facial swelling, trouble breathing or swallowing.  Very painful to touch her teeth together or to chew.  No neck pain or swelling.  She is tried some Aleve and Tylenol though she is not sure she is taking the right doses.  She has a dentist appointment for March 3.  Past Medical History:  Diagnosis Date  . Asthma   . Chlamydia    Pt reports treated 12/2012; no further contact with that partner  . Cholecystitis   . Dysrhythmia, cardiac   . GERD (gastroesophageal reflux disease)   . Kidney stone     Patient Active Problem List   Diagnosis Date Noted  . Screening examination for STD (sexually transmitted disease) 09/13/2013  . Vaginitis 09/13/2013    Past Surgical History:  Procedure Laterality Date  . CHOLECYSTECTOMY    . KNEE SURGERY       OB History    Gravida  2   Para  2   Term  2   Preterm      AB      Living  2     SAB      TAB      Ectopic      Multiple      Live Births               Home Medications    Prior to Admission medications   Medication Sig Start Date End Date Taking? Authorizing Provider  acetaminophen (TYLENOL) 500 MG tablet Take 1 tablet (500 mg total) by mouth every 6 (six) hours as needed. 12/09/18   Pricilla Loveless, MD  albuterol (PROVENTIL HFA;VENTOLIN HFA) 108 (90 Base) MCG/ACT inhaler Inhale 1-2 puffs into the lungs every 6 (six) hours as needed for wheezing or shortness of breath. 09/29/18   Liberty Handy, PA-C  Etonogestrel (IMPLANON [place]) Inject 1 each into the skin  once. Placed in 04/16/2016    [provider]  fluticasone (FLONASE) 50 MCG/ACT nasal spray Place 1 spray into both nostrils daily. 11/11/18   Aviva Kluver B, PA-C  naproxen (NAPROSYN) 375 MG tablet Take 1 tablet (375 mg total) by mouth 2 (two) times daily. 12/09/18   Pricilla Loveless, MD  ondansetron (ZOFRAN ODT) 8 MG disintegrating tablet Take 1 tablet (8 mg total) by mouth every 8 (eight) hours as needed for nausea or vomiting. 12/02/17   Molpus, Jonny Ruiz, MD  oxyCODONE-acetaminophen (PERCOCET) 5-325 MG tablet Take 1-2 tablets by mouth every 4 (four) hours as needed for severe pain. 12/02/17   Molpus, John, MD  penicillin v potassium (VEETID) 500 MG tablet Take 2 tablets (1,000 mg total) by mouth 2 (two) times daily. X 7 days 12/09/18   Pricilla Loveless, MD    Family History Family History  Problem Relation Age of Onset  . Stroke Mother   . Gout Father     Social History Social History   Tobacco Use  . Smoking status: Never Smoker  . Smokeless tobacco: Never Used  Substance Use Topics  . Alcohol use: Yes  . Drug use: No     Allergies   Other   Review of Systems Review of Systems  Constitutional: Negative for fever.  HENT: Positive for dental problem. Negative for trouble swallowing.   Respiratory: Negative for shortness of breath.   Gastrointestinal: Negative for vomiting.     Physical Exam Updated Vital Signs BP (!) 120/52   Pulse 67   Temp 98.1 F (36.7 C) (Oral)   Resp 16   Ht  (1.803 m)   Wt (!) 145.2 kg   SpO2 100%   BMI 44.63 kg/m   Physical Exam Vitals signs and nursing note reviewed.  Constitutional:      Appearance: She is well-developed. She is obese.  HENT:     Head: Normocephalic and atraumatic.     Right Ear: External ear normal.     Left Ear: External ear normal.     Nose: Nose normal.     Mouth/Throat:     Dentition: Abnormal dentition. Dental tenderness present.   Eyes:     General:        Right eye: No discharge.        Left  eye: No discharge.  Neck:     Musculoskeletal: Normal range of motion and neck supple. No neck rigidity.  Cardiovascular:     Rate and Rhythm: Normal rate and regular rhythm.     Heart sounds: Normal heart sounds.  Pulmonary:     Effort: Pulmonary effort is normal.     Breath sounds: Normal breath sounds.  Skin:    General: Skin is warm and dry.  Neurological:     Mental Status: She is alert.  Psychiatric:        Mood and Affect: Mood is not anxious.      ED Treatments / Results  Labs (all labs ordered are listed, but only abnormal results are displayed) Labs Reviewed - No data to display  EKG None  Radiology No results found.  Procedures Dental Block Date/Time: 12/09/2018 7:26 AM Performed by: Pricilla Loveless, MD Authorized by: Pricilla Loveless, MD   Consent:    Consent obtained:  Verbal   Consent given by:  Patient Indications:    Indications: dental pain   Location:    Block type:  Inferior alveolar   Laterality:  Left Procedure details (see MAR for exact dosages):    Syringe type:  Controlled syringe   Needle gauge:  27 G   Anesthetic injected:  Bupivacaine 0.5% WITH epi   Injection procedure:  Anatomic landmarks identified, introduced needle, incremental injection and anatomic landmarks palpated Post-procedure details:    Outcome:  Anesthesia achieved   Patient tolerance of procedure:  Tolerated well, no immediate complications   (including critical care time)  Medications Ordered in ED Medications  bupivacaine-epinephrine (MARCAINE W/ EPI) 0.5% -1:200000 injection 1.8 mL (has no administration in time range)     Initial Impression / Assessment and Plan / ED Course  I have reviewed the triage vital signs and the nursing notes.  Pertinent labs & imaging results that were available during my care of the patient were reviewed by me and considered in my medical decision making (see chart for details).        Patient request dental block.  Likely  has pulpitis but with her worsening pain, will cover for infectious cause and give antibiotics.  I have discussed that I will prescribe NSAIDs and Tylenol at adequate dosing  and cautioned her about taking more than prescribed.  Otherwise, follow-up with dentist as scheduled.  Return precautions discussed.  Final Clinical Impressions(s) / ED Diagnoses   Final diagnoses:  Pain due to dental caries    ED Discharge Orders         Ordered    naproxen (NAPROSYN) 375 MG tablet  2 times daily     12/09/18 0721    acetaminophen (TYLENOL) 500 MG tablet  Every 6 hours PRN     12/09/18 0721    penicillin v potassium (VEETID) 500 MG tablet  2 times daily     12/09/18 5638           Pricilla Loveless, MD 12/09/18 [phone]

## 2018-12-09 NOTE — Discharge Instructions (Signed)
Return to the ER if you develop fever, vomiting, trouble breathing or swallowing, inability to fully open your mouth, or facial swelling, or any other new/concerning symptoms.  You are being prescribed naproxen and Tylenol, do not take any other medicines such as NSAIDs like ibuprofen, aspirin, Aleve, Advil, Naprosyn, etc.  Do not take any other medicines containing Tylenol.  Do not take either of these medicines more than prescribed.

## 2018-12-25 ENCOUNTER — Emergency Department (HOSPITAL_BASED_OUTPATIENT_CLINIC_OR_DEPARTMENT_OTHER)
Admission: EM | Admit: 2018-12-25 | Discharge: 2018-12-25 | Disposition: A | Discharge status: Home / Self Care | Payer: Medicaid Other | Attending: Emergency Medicine | Admitting: Emergency Medicine

## 2018-12-25 ENCOUNTER — Other Ambulatory Visit: Payer: Self-pay

## 2018-12-25 ENCOUNTER — Encounter (HOSPITAL_BASED_OUTPATIENT_CLINIC_OR_DEPARTMENT_OTHER): Payer: Self-pay | Admitting: Emergency Medicine

## 2018-12-25 DIAGNOSIS — K0889 Other specified disorders of teeth and supporting structures: Secondary | ICD-10-CM | POA: Diagnosis not present

## 2018-12-25 DIAGNOSIS — J45909 Unspecified asthma, uncomplicated: Secondary | ICD-10-CM | POA: Insufficient documentation

## 2018-12-25 MED ORDER — OXYCODONE-ACETAMINOPHEN 5-325 MG PO TABS
1.0000 | ORAL_TABLET | Freq: Once | ORAL | Status: AC
  Administered 2018-12-25: 1 via ORAL
  Filled 2018-12-25: qty 1

## 2018-12-25 NOTE — Discharge Instructions (Addendum)
Please call a dentist for follow up  Take ibuprofen and tylenol for pain

## 2018-12-25 NOTE — ED Triage Notes (Signed)
Pt here with tooth pain on whole bottom jaw x 3 months. She cannot find a dentist that will see her and she cannot afford it.

## 2018-12-25 NOTE — ED Provider Notes (Signed)
[facility] EMERGENCY DEPARTMENT Provider Note   CSN: [id number] Arrival date & time: 12/25/18  1248    History   Chief Complaint Chief Complaint  Patient presents with  . Dental Pain    HPI Linda Chen is a 33 y.o. female.     HPI Patient is a 33 year old female presents the emergency department with lower dental pain to the majority of all of her lower teeth over the past 3 months.  She is called multiple dentists and cannot afford to see a dentist.  She is here requesting something stronger than ibuprofen and Tylenol for pain.  No difficulty breathing or swallowing.  Pain is persistent and moderate to severe in severity.  No neck swelling.  No difficulty breathing or swallowing.  No other complaints at this time.  Past Medical History:  Diagnosis Date  . Asthma   . Chlamydia    Pt reports treated 12/2012; no further contact with that partner  . Cholecystitis   . Dysrhythmia, cardiac   . GERD (gastroesophageal reflux disease)   . Kidney stone     Patient Active Problem List   Diagnosis Date Noted  . Screening examination for STD (sexually transmitted disease) 09/13/2013  . Vaginitis 09/13/2013    Past Surgical History:  Procedure Laterality Date  . CHOLECYSTECTOMY    . KNEE SURGERY       OB History    Gravida  2   Para  2   Term  2   Preterm      AB      Living  2     SAB      TAB      Ectopic      Multiple      Live Births               Home Medications    Prior to Admission medications   Medication Sig Start Date End Date Taking? Authorizing Provider  acetaminophen (TYLENOL) 500 MG tablet Take 1 tablet (500 mg total) by mouth every 6 (six) hours as needed. 12/09/18   Pricilla Loveless, MD  albuterol (PROVENTIL HFA;VENTOLIN HFA) 108 (90 Base) MCG/ACT inhaler Inhale 1-2 puffs into the lungs every 6 (six) hours as needed for wheezing or shortness of breath. 09/29/18   Liberty Handy, PA-C  Etonogestrel (IMPLANON [place])  Inject 1 each into the skin once. Placed in 04/16/2016    [provider]  fluticasone (FLONASE) 50 MCG/ACT nasal spray Place 1 spray into both nostrils daily. 11/11/18   Aviva Kluver B, PA-C  naproxen (NAPROSYN) 375 MG tablet Take 1 tablet (375 mg total) by mouth 2 (two) times daily. 12/09/18   Pricilla Loveless, MD  ondansetron (ZOFRAN ODT) 8 MG disintegrating tablet Take 1 tablet (8 mg total) by mouth every 8 (eight) hours as needed for nausea or vomiting. 12/02/17   Molpus, Jonny Ruiz, MD  oxyCODONE-acetaminophen (PERCOCET) 5-325 MG tablet Take 1-2 tablets by mouth every 4 (four) hours as needed for severe pain. 12/02/17   Molpus, John, MD  penicillin v potassium (VEETID) 500 MG tablet Take 2 tablets (1,000 mg total) by mouth 2 (two) times daily. X 7 days 12/09/18   Pricilla Loveless, MD    Family History Family History  Problem Relation Age of Onset  . Stroke Mother   . Gout Father     Social History Social History   Tobacco Use  . Smoking status: Never Smoker  . Smokeless tobacco: Never Used  Substance Use  Topics  . Alcohol use: Yes  . Drug use: No     Allergies   Other   Review of Systems Review of Systems  All other systems reviewed and are negative.    Physical Exam Updated Vital Signs BP 139/80 (BP Location: Left Arm)   Pulse 72   Temp 98.1 F (36.7 C)   Resp 16   Ht 6\' 1"  (1.854 m)   Wt (!) 145.2 kg   SpO2 100%   BMI 42.22 kg/m   Physical Exam Vitals signs and nursing note reviewed.  Constitutional:      Appearance: She is well-developed.  HENT:     Head: Normocephalic.     Comments: Mild generalized lower abdominal dental decay with the most tenderness and dental decay noted at the right lower first molar.  No gingival swelling or fluctuance.  No swelling under her tongue.  Tolerating secretions.  Oral airway patent.  Speech normal.  Anterior neck normal. Neck:     Musculoskeletal: Normal range of motion.  Pulmonary:     Effort: Pulmonary effort is  normal.  Abdominal:     General: There is no distension.  Musculoskeletal: Normal range of motion.  Neurological:     Mental Status: She is alert and oriented to person, place, and time.      ED Treatments / Results  Labs (all labs ordered are listed, but only abnormal results are displayed) Labs Reviewed - No data to display  EKG None  Radiology No results found.  Procedures Procedures (including critical care time)  Medications Ordered in ED Medications  oxyCODONE-acetaminophen (PERCOCET/ROXICET) 5-325 MG per tablet 1 tablet (has no administration in time range)     Initial Impression / Assessment and Plan / ED Course  I have reviewed the triage vital signs and the nursing notes.  Pertinent labs & imaging results that were available during my care of the patient were reviewed by me and considered in my medical decision making (see chart for details).        Unfortunately there are no great options to the emergency department.  She has been given a Insurance claims handler.  I recommended ibuprofen and Tylenol for pain.  She just finished a course of antibiotics.  Final Clinical Impressions(s) / ED Diagnoses   Final diagnoses:  Pain, dental    ED Discharge Orders    None       Azalia Bilis, MD 12/25/18 1320

## 2018-12-30 ENCOUNTER — Other Ambulatory Visit: Payer: Self-pay

## 2018-12-30 ENCOUNTER — Encounter (HOSPITAL_BASED_OUTPATIENT_CLINIC_OR_DEPARTMENT_OTHER): Payer: Self-pay | Admitting: Emergency Medicine

## 2018-12-30 ENCOUNTER — Emergency Department (HOSPITAL_BASED_OUTPATIENT_CLINIC_OR_DEPARTMENT_OTHER)
Admission: EM | Admit: 2018-12-30 | Discharge: 2018-12-30 | Disposition: A | Discharge status: Home / Self Care | Payer: Medicaid Other | Attending: Emergency Medicine | Admitting: Emergency Medicine

## 2018-12-30 DIAGNOSIS — K0889 Other specified disorders of teeth and supporting structures: Secondary | ICD-10-CM

## 2018-12-30 DIAGNOSIS — K029 Dental caries, unspecified: Secondary | ICD-10-CM | POA: Diagnosis not present

## 2018-12-30 DIAGNOSIS — J45909 Unspecified asthma, uncomplicated: Secondary | ICD-10-CM | POA: Insufficient documentation

## 2018-12-30 MED ORDER — ETODOLAC 300 MG PO CAPS: 300.0000 mg | ORAL_CAPSULE | Freq: Three times a day (TID) | ORAL | 0 refills | Status: AC

## 2018-12-30 MED ORDER — HYDROCODONE-ACETAMINOPHEN 5-325 MG PO TABS
1.0000 | ORAL_TABLET | ORAL | Status: AC
  Administered 2018-12-30: 1 via ORAL
  Filled 2018-12-30: qty 1

## 2018-12-30 MED ORDER — PENICILLIN V POTASSIUM 500 MG PO TABS: 500.0000 mg | ORAL_TABLET | Freq: Three times a day (TID) | ORAL | 0 refills | Status: DC

## 2018-12-30 NOTE — ED Provider Notes (Signed)
[facility] EMERGENCY DEPARTMENT Provider Note   CSN: [id number] Arrival date & time: 12/30/18  1117    History   Chief Complaint Chief Complaint  Patient presents with  . Dental Pain    HPI Linda Chen is a 33 y.o. female.     HPI Patient presents to the ED for evaluation of persistent dental pain.  Patient states she has had difficulty with pain in her teeth for months.  This is her third visit to the ED since the end of February.  Patient has been unable to follow-up with a dentist.  Recently she has been running into difficulty because dentist's office have been closed for routine evaluation because of the coronavirus.  Pain is in both sides.  It hurts for her to bite and chew.  She denies any fevers.  No swelling.  No vomiting or diarrhea. Past Medical History:  Diagnosis Date  . Asthma   . Chlamydia    Pt reports treated 12/2012; no further contact with that partner  . Cholecystitis   . Dysrhythmia, cardiac   . GERD (gastroesophageal reflux disease)   . Kidney stone     Patient Active Problem List   Diagnosis Date Noted  . Screening examination for STD (sexually transmitted disease) 09/13/2013  . Vaginitis 09/13/2013    Past Surgical History:  Procedure Laterality Date  . CHOLECYSTECTOMY    . KNEE SURGERY       OB History    Gravida  2   Para  2   Term  2   Preterm      AB      Living  2     SAB      TAB      Ectopic      Multiple      Live Births               Home Medications    Prior to Admission medications   Medication Sig Start Date End Date Taking? Authorizing Provider  acetaminophen (TYLENOL) 500 MG tablet Take 1 tablet (500 mg total) by mouth every 6 (six) hours as needed. 12/09/18   Pricilla Loveless, MD  albuterol (PROVENTIL HFA;VENTOLIN HFA) 108 (90 Base) MCG/ACT inhaler Inhale 1-2 puffs into the lungs every 6 (six) hours as needed for wheezing or shortness of breath. 09/29/18   Liberty Handy, PA-C   etodolac (LODINE) 300 MG capsule Take 1 capsule (300 mg total) by mouth every 8 (eight) hours for 10 days. 12/30/18 01/09/19  Linwood Dibbles, MD  Etonogestrel [facility]) Inject 1 each into the skin once. Placed in 04/16/2016    [provider]  fluticasone (FLONASE) 50 MCG/ACT nasal spray Place 1 spray into both nostrils daily. 11/11/18   Aviva Kluver B, PA-C  ondansetron (ZOFRAN ODT) 8 MG disintegrating tablet Take 1 tablet (8 mg total) by mouth every 8 (eight) hours as needed for nausea or vomiting. 12/02/17   Molpus, Jonny Ruiz, MD  penicillin v potassium (VEETID) 500 MG tablet Take 1 tablet (500 mg total) by mouth 3 (three) times daily. 12/30/18   Linwood Dibbles, MD    Family History Family History  Problem Relation Age of Onset  . Stroke Mother   . Gout Father     Social History Social History   Tobacco Use  . Smoking status: Never Smoker  . Smokeless tobacco: Never Used  Substance Use Topics  . Alcohol use: Yes  . Drug use: No  Allergies   Other   Review of Systems Review of Systems  All other systems reviewed and are negative.    Physical Exam Updated Vital Signs BP (!) 130/98 (BP Location: Right Arm)   Pulse 79   Temp 98 F (36.7 C) (Oral)   Resp 16   Ht 1.854 m (6\' 1" )   Wt (!) 145 kg   LMP 12/30/2018   SpO2 100%   BMI 42.17 kg/m   Physical Exam Vitals signs and nursing note reviewed.  Constitutional:      General: She is not in acute distress.    Appearance: She is well-developed.  HENT:     Head: Normocephalic and atraumatic.     Right Ear: External ear normal.     Left Ear: External ear normal.     Mouth/Throat:     Mouth: Mucous membranes are moist.     Dentition: Dental tenderness and dental caries present. No gingival swelling or dental abscesses.     Tongue: No lesions.     Palate: No mass.     Pharynx: No pharyngeal swelling, oropharyngeal exudate, posterior oropharyngeal erythema or uvula swelling.  Eyes:     General: No scleral icterus.        Right eye: No discharge.        Left eye: No discharge.     Conjunctiva/sclera: Conjunctivae normal.  Neck:     Musculoskeletal: Neck supple.     Trachea: No tracheal deviation.  Cardiovascular:     Rate and Rhythm: Normal rate.  Pulmonary:     Effort: Pulmonary effort is normal. No respiratory distress.     Breath sounds: No stridor.  Abdominal:     General: There is no distension.  Musculoskeletal:        General: No swelling or deformity.  Skin:    General: Skin is warm and dry.     Findings: No rash.  Neurological:     Mental Status: She is alert.     Cranial Nerves: Cranial nerve deficit: no gross deficits.      ED Treatments / Results  Labs (all labs ordered are listed, but only abnormal results are displayed) Labs Reviewed - No data to display  EKG None  Radiology No results found.  Procedures Procedures (including critical care time)  Medications Ordered in ED Medications  HYDROcodone-acetaminophen (NORCO/VICODIN) 5-325 MG per tablet 1 tablet (has no administration in time range)     Initial Impression / Assessment and Plan / ED Course  I have reviewed the triage vital signs and the nursing notes.  Pertinent labs & imaging results that were available during my care of the patient were reviewed by me and considered in my medical decision making (see chart for details).   Patient has been having trouble with persistent dental pain.  She has obvious dental caries in her posterior molars, but there is no evidence of any swelling.  I doubt severe abscess.  Unfortunately she has had difficulty getting in with a dentist.  I do not have anyone specifically on call today.  I did provide the dental resource guide.  I explained to her that dentists are certainly not doing routine evaluations but some offices still should be doing acute treatments.  Final Clinical Impressions(s) / ED Diagnoses   Final diagnoses:  Pain, dental  Dental caries    ED  Discharge Orders         Ordered    penicillin v potassium (VEETID) 500 MG tablet  3 times daily     12/30/18 1137    etodolac (LODINE) 300 MG capsule  Every 8 hours    Note to Pharmacy:  As needed for pain   12/30/18 1137           Linwood Dibbles, MD 12/30/18 1142

## 2018-12-30 NOTE — Discharge Instructions (Signed)
Follow up with a dentist as soon as you are able, take the medications as prescribed, monitor for fever, swelling

## 2018-12-30 NOTE — ED Triage Notes (Signed)
Lower dental pain on both sides x 3 months. Unable to get in with a dentist.

## 2019-01-05 ENCOUNTER — Emergency Department (HOSPITAL_BASED_OUTPATIENT_CLINIC_OR_DEPARTMENT_OTHER)
Admission: EM | Admit: 2019-01-05 | Discharge: 2019-01-05 | Disposition: A | Discharge status: Home / Self Care | Payer: Medicaid Other | Attending: Emergency Medicine | Admitting: Emergency Medicine

## 2019-01-05 ENCOUNTER — Encounter (HOSPITAL_BASED_OUTPATIENT_CLINIC_OR_DEPARTMENT_OTHER): Payer: Self-pay | Admitting: Emergency Medicine

## 2019-01-05 ENCOUNTER — Other Ambulatory Visit: Payer: Self-pay

## 2019-01-05 DIAGNOSIS — Z79899 Other long term (current) drug therapy: Secondary | ICD-10-CM | POA: Insufficient documentation

## 2019-01-05 DIAGNOSIS — K0889 Other specified disorders of teeth and supporting structures: Secondary | ICD-10-CM | POA: Diagnosis not present

## 2019-01-05 DIAGNOSIS — J45909 Unspecified asthma, uncomplicated: Secondary | ICD-10-CM | POA: Insufficient documentation

## 2019-01-05 MED ORDER — BUPIVACAINE HCL 0.25 % IJ SOLN: 10.0000 mL | Freq: Once | INTRAMUSCULAR | Status: DC

## 2019-01-05 MED ORDER — LIDOCAINE HCL (PF) 1 % IJ SOLN: 5.0000 mL | Freq: Once | INTRAMUSCULAR | Status: DC

## 2019-01-05 MED ORDER — BUPIVACAINE-EPINEPHRINE (PF) 0.5% -1:200000 IJ SOLN
1.8000 mL | Freq: Once | INTRAMUSCULAR | Status: AC
  Administered 2019-01-05: 1.8 mL

## 2019-01-05 MED ORDER — BUPIVACAINE-EPINEPHRINE (PF) 0.5% -1:200000 IJ SOLN
INTRAMUSCULAR | Status: AC
  Administered 2019-01-05: 1.8 mL
  Filled 2019-01-05: qty 1.8

## 2019-01-05 NOTE — ED Triage Notes (Signed)
Pt c/o dental pain. Pt has been seen here previously for same. Pt states she has dental apt for extraction x 2 weeks.

## 2019-01-05 NOTE — ED Provider Notes (Signed)
[facility] EMERGENCY DEPARTMENT Provider Note   CSN: [id number] Arrival date & time: 01/05/19  5686    History   Chief Complaint Chief Complaint  Patient presents with  . Dental Pain    HPI Linda Chen is a 33 y.o. female.     The history is provided by the patient.  Dental Pain  Location:  Lower Quality:  Constant Severity:  Severe Onset quality:  Gradual Progression:  Worsening Chronicity:  Recurrent Relieved by:  Nothing Worsened by:  Jaw movement and pressure Associated symptoms: no facial swelling and no fever   Patient presents for dental pain.  She has had ongoing issues recently, now worsening pain in left lower jaw.  No fevers or vomiting.  She has follow-up with dentistry next month.  She is currently on penicillin and does have pain medications at home, but this is not working.  She is requesting assistance with pain with an anesthetic She plans to call dentist today to see if she can move up appointment   Past Medical History:  Diagnosis Date  . Asthma   . Chlamydia    Pt reports treated 12/2012; no further contact with that partner  . Cholecystitis   . Dysrhythmia, cardiac   . GERD (gastroesophageal reflux disease)   . Kidney stone     Patient Active Problem List   Diagnosis Date Noted  . Screening examination for STD (sexually transmitted disease) 09/13/2013  . Vaginitis 09/13/2013    Past Surgical History:  Procedure Laterality Date  . CHOLECYSTECTOMY    . KNEE SURGERY       OB History    Gravida  2   Para  2   Term  2   Preterm      AB      Living  2     SAB      TAB      Ectopic      Multiple      Live Births               Home Medications    Prior to Admission medications   Medication Sig Start Date End Date Taking? Authorizing Provider  acetaminophen (TYLENOL) 500 MG tablet Take 1 tablet (500 mg total) by mouth every 6 (six) hours as needed. 12/09/18   Pricilla Loveless, MD  albuterol  (PROVENTIL HFA;VENTOLIN HFA) 108 (90 Base) MCG/ACT inhaler Inhale 1-2 puffs into the lungs every 6 (six) hours as needed for wheezing or shortness of breath. 09/29/18   Liberty Handy, PA-C  etodolac (LODINE) 300 MG capsule Take 1 capsule (300 mg total) by mouth every 8 (eight) hours for 10 days. 12/30/18 01/09/19  Linwood Dibbles, MD  Etonogestrel [facility]) Inject 1 each into the skin once. Placed in 04/16/2016    [provider]  fluticasone (FLONASE) 50 MCG/ACT nasal spray Place 1 spray into both nostrils daily. 11/11/18   Aviva Kluver B, PA-C  ondansetron (ZOFRAN ODT) 8 MG disintegrating tablet Take 1 tablet (8 mg total) by mouth every 8 (eight) hours as needed for nausea or vomiting. 12/02/17   Molpus, Jonny Ruiz, MD  penicillin v potassium (VEETID) 500 MG tablet Take 1 tablet (500 mg total) by mouth 3 (three) times daily. 12/30/18   Linwood Dibbles, MD    Family History Family History  Problem Relation Age of Onset  . Stroke Mother   . Gout Father     Social History Social History   Tobacco Use  .  Smoking status: Never Smoker  . Smokeless tobacco: Never Used  Substance Use Topics  . Alcohol use: Yes  . Drug use: No     Allergies   Other   Review of Systems Review of Systems  Constitutional: Negative for fever.  HENT: Negative for facial swelling.   Gastrointestinal: Negative for vomiting.     Physical Exam Updated Vital Signs BP 120/72 (BP Location: Left Arm)   Pulse 64   Temp 98 F (36.7 C) (Oral)   Resp 18   Ht 1.854 m (6\' 1" )   Wt (!) 145 kg   LMP 12/30/2018   SpO2 99%   BMI 42.17 kg/m   Physical Exam CONSTITUTIONAL: Well developed/well nourished HEAD: Normocephalic/atraumatic EYES: EOMI/PERRL ENMT: Mucous membranes moist, poor dentition, tenderness to left lower molar.  No abscess, no trismus. NECK: supple no meningeal signs SPINE/BACK:entire spine nontender CV: S1/S2 noted, no murmurs/rubs/gallops noted LUNGS: Lungs are clear to auscultation  bilaterally, no apparent distress ABDOMEN: soft NEURO: Pt is awake/alert/appropriate, moves all extremitiesx4.    EXTREMITIES:  full ROM SKIN: warm, color normal PSYCH: no abnormalities of mood noted, alert and oriented to situation   ED Treatments / Results  Labs (all labs ordered are listed, but only abnormal results are displayed) Labs Reviewed - No data to display  EKG None  Radiology No results found.  Procedures Dental Block Date/Time: 01/05/2019 5:50 AM Performed by: Zadie Rhine, MD Authorized by: Zadie Rhine, MD   Consent:    Consent obtained:  Verbal   Consent given by:  Patient   Alternatives discussed:  No treatment Indications:    Indications: dental pain   Location:    Block type:  Inferior alveolar   Laterality:  Left Procedure details (see MAR for exact dosages):    Anesthetic injected:  Bupivacaine 0.25% WITH epi   Injection procedure:  Anatomic landmarks identified Post-procedure details:    Outcome:  Anesthesia achieved   Patient tolerance of procedure:  Tolerated well, no immediate complications    Medications Ordered in ED Medications  bupivacaine-epinephrine (MARCAINE W/ EPI) 0.5% -1:200000 injection 1.8 mL (1.8 mLs Infiltration Given by Other 01/05/19 0537)     Initial Impression / Assessment and Plan / ED Course  I have reviewed the triage vital signs and the nursing notes.      Patient has plan in place for dental appointment next month but actually tried to call today to get an earlier.  Advised to continue antibiotics.  Final Clinical Impressions(s) / ED Diagnoses   Final diagnoses:  Pain, dental    ED Discharge Orders    None       Zadie Rhine, MD 01/05/19 [phone]

## 2019-08-18 ENCOUNTER — Emergency Department (HOSPITAL_BASED_OUTPATIENT_CLINIC_OR_DEPARTMENT_OTHER)
Admission: EM | Admit: 2019-08-18 | Discharge: 2019-08-18 | Disposition: A | Discharge status: Home / Self Care | Payer: Medicaid Other | Attending: Emergency Medicine | Admitting: Emergency Medicine

## 2019-08-18 ENCOUNTER — Emergency Department (HOSPITAL_BASED_OUTPATIENT_CLINIC_OR_DEPARTMENT_OTHER): Payer: Medicaid Other

## 2019-08-18 ENCOUNTER — Encounter (HOSPITAL_BASED_OUTPATIENT_CLINIC_OR_DEPARTMENT_OTHER): Payer: Self-pay

## 2019-08-18 ENCOUNTER — Other Ambulatory Visit: Payer: Self-pay

## 2019-08-18 DIAGNOSIS — M25561 Pain in right knee: Secondary | ICD-10-CM | POA: Diagnosis not present

## 2019-08-18 DIAGNOSIS — Z79899 Other long term (current) drug therapy: Secondary | ICD-10-CM | POA: Insufficient documentation

## 2019-08-18 DIAGNOSIS — J45909 Unspecified asthma, uncomplicated: Secondary | ICD-10-CM | POA: Insufficient documentation

## 2019-08-18 DIAGNOSIS — Z793 Long term (current) use of hormonal contraceptives: Secondary | ICD-10-CM | POA: Insufficient documentation

## 2019-08-18 MED ORDER — MELOXICAM 15 MG PO TABS: 15.0000 mg | ORAL_TABLET | Freq: Every day | ORAL | 0 refills | Status: DC | PRN

## 2019-08-18 NOTE — ED Notes (Signed)
Patient transported to X-ray 

## 2019-08-18 NOTE — Discharge Instructions (Addendum)
Your x-rays look fine. Your orthopedic hardware is in place. I want you to follow back up with Dr Onnie Graham. Take pain medicine as needed.

## 2019-08-18 NOTE — ED Triage Notes (Signed)
Pt states knee pain, stiffness for past three days.  Has chronic knee issues, had knee replacement at age 33.  States feels like if she tries to bend knee it will pop.  Able to bear weight, ambulated to room.

## 2019-08-21 NOTE — ED Provider Notes (Signed)
[facility] EMERGENCY DEPARTMENT Provider Note   CSN: [id number] Arrival date & time: 08/18/19  0802     History   Chief Complaint Chief Complaint  Patient presents with  . Knee Pain    HPI Linda Chen is a 33 y.o. female.     HPI   33 year old female with right knee pain.  Worsening the past couple weeks, particularly last 2 days.  She reports history of chronic knee pain.  She reports that she had surgery as a teenager after an injury while running track.  In the past several days she has had increasing pain.  She feels like her knee will buckle at times.  She can bear weight although with increased pain.  Denies any acute injuries.  Past Medical History:  Diagnosis Date  . Asthma   . Chlamydia    Pt reports treated 12/2012; no further contact with that partner  . Cholecystitis   . Dysrhythmia, cardiac   . GERD (gastroesophageal reflux disease)   . Kidney stone     Patient Active Problem List   Diagnosis Date Noted  . Screening examination for STD (sexually transmitted disease) 09/13/2013  . Vaginitis 09/13/2013    Past Surgical History:  Procedure Laterality Date  . CHOLECYSTECTOMY    . KNEE SURGERY       OB History    Gravida  2   Para  2   Term  2   Preterm      AB      Living  2     SAB      TAB      Ectopic      Multiple      Live Births               Home Medications    Prior to Admission medications   Medication Sig Start Date End Date Taking? Authorizing Provider  Etonogestrel (IMPLANON [place]) Inject 1 each into the skin once. Placed in 04/16/2016    [provider]  meloxicam (MOBIC) 15 MG tablet Take 1 tablet (15 mg total) by mouth daily as needed for pain. 08/18/19   Raeford Razor, MD  albuterol (PROVENTIL HFA;VENTOLIN HFA) 108 (90 Base) MCG/ACT inhaler Inhale 1-2 puffs into the lungs every 6 (six) hours as needed for wheezing or shortness of breath. 09/29/18 08/18/19  Liberty Handy, PA-C   fluticasone (FLONASE) 50 MCG/ACT nasal spray Place 1 spray into both nostrils daily. 11/11/18 08/18/19  Elisha Ponder, PA-C    Family History Family History  Problem Relation Age of Onset  . Stroke Mother   . Gout Father     Social History Social History   Tobacco Use  . Smoking status: Never Smoker  . Smokeless tobacco: Never Used  Substance Use Topics  . Alcohol use: Yes  . Drug use: No     Allergies   Other   Review of Systems Review of Systems  All systems reviewed and negative, other than as noted in HPI.  Physical Exam Updated Vital Signs BP 134/72   Pulse 62   Temp 98.1 F (36.7 C) (Oral)   Resp 18   Ht 6\' 1"  (1.854 m)   Wt (!) 147 kg   LMP 07/28/2019   SpO2 100%   BMI 42.75 kg/m   Physical Exam Vitals signs and nursing note reviewed.  Constitutional:      General: She is not in acute distress.    Appearance: She is well-developed.  HENT:     Head: Normocephalic and atraumatic.  Eyes:     General:        Right eye: No discharge.        Left eye: No discharge.     Conjunctiva/sclera: Conjunctivae normal.  Neck:     Musculoskeletal: Neck supple.  Cardiovascular:     Rate and Rhythm: Normal rate and regular rhythm.     Heart sounds: Normal heart sounds. No murmur. No friction rub. No gallop.   Pulmonary:     Effort: Pulmonary effort is normal. No respiratory distress.     Breath sounds: Normal breath sounds.  Abdominal:     General: There is no distension.     Palpations: Abdomen is soft.     Tenderness: There is no abdominal tenderness.  Musculoskeletal:        General: Tenderness present.     Comments: Surgical scar anterior right knee.  Grossly appears normal otherwise.  Some tenderness to palpation posteriorly.  No obvious laxity appreciated but exam is somewhat limited by body habitus.  Neurovascularly intact.  Skin:    General: Skin is warm and dry.  Neurological:     Mental Status: She is alert.  Psychiatric:        Behavior:  Behavior normal.        Thought Content: Thought content normal.      ED Treatments / Results  Labs (all labs ordered are listed, but only abnormal results are displayed) Labs Reviewed - No data to display  EKG None  Radiology No results found.  Procedures Procedures (including critical care time)  Medications Ordered in ED Medications - No data to display   Initial Impression / Assessment and Plan / ED Course  I have reviewed the triage vital signs and the nursing notes.  Pertinent labs & imaging results that were available during my care of the patient were reviewed by me and considered in my medical decision making (see chart for details).       33 year old female with worsening atraumatic right knee pain.  No osseous abnormality noted on x-ray.  Neurovascular intact.  Will place in a knee immobilizer.  Crutches if needed.  Will need Ortho follow-up for further evaluation.  Final Clinical Impressions(s) / ED Diagnoses   Final diagnoses:  Acute pain of right knee    ED Discharge Orders         Ordered    meloxicam (MOBIC) 15 MG tablet  Daily PRN     08/18/19 0947           Virgel Manifold, MD 08/21/19 1049

## 2020-02-07 ENCOUNTER — Emergency Department (HOSPITAL_BASED_OUTPATIENT_CLINIC_OR_DEPARTMENT_OTHER)
Admission: EM | Admit: 2020-02-07 | Discharge: 2020-02-07 | Disposition: A | Discharge status: Home / Self Care | Payer: Medicaid Other | Attending: Emergency Medicine | Admitting: Emergency Medicine

## 2020-02-07 ENCOUNTER — Other Ambulatory Visit: Payer: Self-pay

## 2020-02-07 ENCOUNTER — Encounter (HOSPITAL_BASED_OUTPATIENT_CLINIC_OR_DEPARTMENT_OTHER): Payer: Self-pay | Admitting: *Deleted

## 2020-02-07 DIAGNOSIS — R21 Rash and other nonspecific skin eruption: Secondary | ICD-10-CM | POA: Diagnosis present

## 2020-02-07 DIAGNOSIS — L259 Unspecified contact dermatitis, unspecified cause: Secondary | ICD-10-CM | POA: Diagnosis not present

## 2020-02-07 DIAGNOSIS — Z79899 Other long term (current) drug therapy: Secondary | ICD-10-CM | POA: Diagnosis not present

## 2020-02-07 DIAGNOSIS — J45909 Unspecified asthma, uncomplicated: Secondary | ICD-10-CM | POA: Insufficient documentation

## 2020-02-07 DIAGNOSIS — L309 Dermatitis, unspecified: Secondary | ICD-10-CM

## 2020-02-07 MED ORDER — PREDNISONE 10 MG PO TABS: 40.0000 mg | ORAL_TABLET | Freq: Every day | ORAL | 0 refills | Status: DC

## 2020-02-07 MED ORDER — PREDNISONE 50 MG PO TABS
60.0000 mg | ORAL_TABLET | Freq: Once | ORAL | Status: AC
  Administered 2020-02-07: 60 mg via ORAL
  Filled 2020-02-07: qty 1

## 2020-02-07 MED ORDER — HYDROXYZINE HCL 25 MG PO TABS: 25.0000 mg | ORAL_TABLET | Freq: Four times a day (QID) | ORAL | 0 refills | Status: DC

## 2020-02-07 MED ORDER — PREDNISONE 10 MG PO TABS: 40.0000 mg | ORAL_TABLET | Freq: Every day | ORAL | 0 refills | Status: AC

## 2020-02-07 NOTE — ED Provider Notes (Signed)
[facility] EMERGENCY DEPARTMENT Provider Note   CSN: [id number] Arrival date & time: 02/07/20  2103     History Chief Complaint  Patient presents with  . Urticaria    Linda Chen is a 34 y.o. female.  HPI     34yo female with history of asthma and eczema presents with concern for rash on her bilateral dorsal forearms.  Began one week ago both locations. Tried triamcinolone, hydrocortisone cream. Has had severe itching and burning.  Can't sleep due to significant itching. Now itching is burning. No fevers. No new medications, pets, soaps, detergents.  No one else with similar rash. Son also has eczema. Has not had rash like this before.     Past Medical History:  Diagnosis Date  . Asthma   . Chlamydia    Pt reports treated 12/2012; no further contact with that partner  . Cholecystitis   . Dysrhythmia, cardiac   . GERD (gastroesophageal reflux disease)   . Kidney stone     Patient Active Problem List   Diagnosis Date Noted  . Screening examination for STD (sexually transmitted disease) 09/13/2013  . Vaginitis 09/13/2013    Past Surgical History:  Procedure Laterality Date  . CHOLECYSTECTOMY    . KNEE SURGERY       OB History    Gravida  2   Para  2   Term  2   Preterm      AB      Living  2     SAB      TAB      Ectopic      Multiple      Live Births              Family History  Problem Relation Age of Onset  . Stroke Mother   . Gout Father     Social History   Tobacco Use  . Smoking status: Never Smoker  . Smokeless tobacco: Never Used  Substance Use Topics  . Alcohol use: Yes  . Drug use: No    Home Medications Prior to Admission medications   Medication Sig Start Date End Date Taking? Authorizing Provider  Etonogestrel (IMPLANON [place]) Inject 1 each into the skin once. Placed in 04/16/2016    [provider]  hydrOXYzine (ATARAX/VISTARIL) 25 MG tablet Take 1-2 tablets (25-50 mg total) by mouth every 6  (six) hours. 02/07/20   Alvira Monday, MD  meloxicam (MOBIC) 15 MG tablet Take 1 tablet (15 mg total) by mouth daily as needed for pain. 08/18/19   Raeford Razor, MD  predniSONE (DELTASONE) 10 MG tablet Take 4 tablets (40 mg total) by mouth daily for 5 days. 02/07/20 02/12/20  Alvira Monday, MD  albuterol (PROVENTIL HFA;VENTOLIN HFA) 108 (90 Base) MCG/ACT inhaler Inhale 1-2 puffs into the lungs every 6 (six) hours as needed for wheezing or shortness of breath. 09/29/18 08/18/19  Liberty Handy, PA-C  fluticasone (FLONASE) 50 MCG/ACT nasal spray Place 1 spray into both nostrils daily. 11/11/18 08/18/19  Aviva Kluver B, PA-C    Allergies    Other  Review of Systems   Review of Systems  Constitutional: Negative for fever.  Respiratory: Negative for cough and shortness of breath.   Cardiovascular: Negative for chest pain.  Gastrointestinal: Negative for nausea and vomiting.  Skin: Positive for rash.  Neurological: Negative for headaches.    Physical Exam Updated Vital Signs BP 138/79   Pulse 78   Temp 98.4 F (36.9 C) (  Oral)   Resp 20   Ht 6\' 1"  (1.854 m)   Wt (!) 147 kg   LMP 02/05/2020   SpO2 100%   BMI 42.76 kg/m   Physical Exam Vitals and nursing note reviewed.  Constitutional:      General: She is not in acute distress.    Appearance: She is well-developed. She is not diaphoretic.  HENT:     Head: Normocephalic and atraumatic.  Eyes:     Conjunctiva/sclera: Conjunctivae normal.  Cardiovascular:     Rate and Rhythm: Normal rate and regular rhythm.  Pulmonary:     Effort: Pulmonary effort is normal. No respiratory distress.  Abdominal:     General: There is no distension.     Palpations: Abdomen is soft.     Tenderness: There is no abdominal tenderness. There is no guarding.  Musculoskeletal:     Cervical back: Normal range of motion.  Skin:    General: Skin is warm and dry.     Findings: Erythema and rash present.  Neurological:     Mental Status: She is  alert and oriented to person, place, and time.       ED Results / Procedures / Treatments   Labs (all labs ordered are listed, but only abnormal results are displayed) Labs Reviewed - No data to display  EKG None  Radiology No results found.  Procedures Procedures (including critical care time)  Medications Ordered in ED Medications  predniSONE (DELTASONE) tablet 60 mg (60 mg Oral Given 02/07/20 2220)    ED Course  I have reviewed the triage vital signs and the nursing notes.  Pertinent labs & imaging results that were available during my care of the patient were reviewed by me and considered in my medical decision making (see chart for details).    MDM Rules/Calculators/A&P                      34yo female with history of asthma and eczema presents with concern for rash on her bilateral dorsal forearms for one week.  Rash does not have the appearance of SSS, TEN, erythroderma, scabies, RMSF or hives.  Appears most consistent with a dermatitis, possible contact to unknown trigger or atopic dermatitis.  Given rx for prednisone, hydroxyzine. Recommend PCP follow up.      Final Clinical Impression(s) / ED Diagnoses Final diagnoses:  Rash  Dermatitis    Rx / DC Orders ED Discharge Orders         Ordered    predniSONE (DELTASONE) 10 MG tablet  Daily,   Status:  Discontinued     02/07/20 2209    hydrOXYzine (ATARAX/VISTARIL) 25 MG tablet  Every 6 hours,   Status:  Discontinued     02/07/20 2211    hydrOXYzine (ATARAX/VISTARIL) 25 MG tablet  Every 6 hours     02/07/20 2219    predniSONE (DELTASONE) 10 MG tablet  Daily     02/07/20 2219           Gareth Morgan, MD 02/08/20 1242

## 2020-02-07 NOTE — ED Triage Notes (Signed)
Rash. Itching all over x 3 days. Hx of eczema.

## 2020-08-03 ENCOUNTER — Inpatient Hospital Stay (HOSPITAL_COMMUNITY): Payer: Medicaid Other

## 2020-08-03 ENCOUNTER — Inpatient Hospital Stay (HOSPITAL_COMMUNITY)
Admission: AD | Admit: 2020-08-03 | Discharge: 2020-08-03 | Disposition: A | Discharge status: Home / Self Care | Payer: Medicaid Other | Attending: Obstetrics and Gynecology | Admitting: Obstetrics and Gynecology

## 2020-08-03 ENCOUNTER — Encounter (HOSPITAL_COMMUNITY): Payer: Self-pay | Admitting: Obstetrics and Gynecology

## 2020-08-03 ENCOUNTER — Other Ambulatory Visit: Payer: Self-pay

## 2020-08-03 DIAGNOSIS — N200 Calculus of kidney: Secondary | ICD-10-CM | POA: Insufficient documentation

## 2020-08-03 DIAGNOSIS — Z791 Long term (current) use of non-steroidal anti-inflammatories (NSAID): Secondary | ICD-10-CM | POA: Insufficient documentation

## 2020-08-03 DIAGNOSIS — R109 Unspecified abdominal pain: Secondary | ICD-10-CM

## 2020-08-03 DIAGNOSIS — Z3A17 17 weeks gestation of pregnancy: Secondary | ICD-10-CM | POA: Insufficient documentation

## 2020-08-03 DIAGNOSIS — Z87442 Personal history of urinary calculi: Secondary | ICD-10-CM | POA: Insufficient documentation

## 2020-08-03 DIAGNOSIS — O26832 Pregnancy related renal disease, second trimester: Secondary | ICD-10-CM | POA: Diagnosis not present

## 2020-08-03 DIAGNOSIS — O26892 Other specified pregnancy related conditions, second trimester: Secondary | ICD-10-CM | POA: Insufficient documentation

## 2020-08-03 DIAGNOSIS — O26899 Other specified pregnancy related conditions, unspecified trimester: Secondary | ICD-10-CM

## 2020-08-03 LAB — URINALYSIS, ROUTINE W REFLEX MICROSCOPIC
Bilirubin Urine: NEGATIVE
Glucose, UA: NEGATIVE mg/dL
Ketones, ur: 5 mg/dL — AB
Nitrite: NEGATIVE
Protein, ur: 30 mg/dL — AB
RBC / HPF: >50 RBC/hpf — ABNORMAL HIGH (ref 0–5)
Specific Gravity, Urine: 1.026 (ref 1.005–1.030)
pH: 5 (ref 5.0–8.0)

## 2020-08-03 LAB — CBC WITH DIFFERENTIAL/PLATELET
Abs Immature Granulocytes: 0.04 10*3/uL (ref 0.00–0.07)
Basophils Absolute: 0 10*3/uL (ref 0.0–0.1)
Basophils Relative: 0 %
Eosinophils Absolute: 0.2 10*3/uL (ref 0.0–0.5)
Eosinophils Relative: 2 %
HCT: 34.2 % — ABNORMAL LOW (ref 36.0–46.0)
Hemoglobin: 10.9 g/dL — ABNORMAL LOW (ref 12.0–15.0)
Immature Granulocytes: 0 %
Lymphocytes Relative: 11 %
Lymphs Abs: 1.1 10*3/uL (ref 0.7–4.0)
MCH: 28.4 pg (ref 26.0–34.0)
MCHC: 31.9 g/dL (ref 30.0–36.0)
MCV: 89.1 fL (ref 80.0–100.0)
Monocytes Absolute: 0.5 10*3/uL (ref 0.1–1.0)
Monocytes Relative: 5 %
Neutro Abs: 8 10*3/uL — ABNORMAL HIGH (ref 1.7–7.7)
Neutrophils Relative %: 82 %
Platelets: 256 10*3/uL (ref 150–400)
RBC: 3.84 MIL/uL — ABNORMAL LOW (ref 3.87–5.11)
RDW: 13.8 % (ref 11.5–15.5)
WBC: 9.9 10*3/uL (ref 4.0–10.5)
nRBC: 0 % (ref 0.0–0.2)

## 2020-08-03 LAB — COMPREHENSIVE METABOLIC PANEL
ALT: 25 U/L (ref 0–44)
AST: 22 U/L (ref 15–41)
Albumin: 3.2 g/dL — ABNORMAL LOW (ref 3.5–5.0)
Alkaline Phosphatase: 43 U/L (ref 38–126)
Anion gap: 9 (ref 5–15)
BUN: 9 mg/dL (ref 6–20)
CO2: 24 mmol/L (ref 22–32)
Calcium: 9.4 mg/dL (ref 8.9–10.3)
Chloride: 104 mmol/L (ref 98–111)
Creatinine, Ser: 0.95 mg/dL (ref 0.44–1.00)
GFR, Estimated: >60 mL/min (ref >60)
Glucose, Bld: 105 mg/dL — ABNORMAL HIGH (ref 70–99)
Potassium: 4 mmol/L (ref 3.5–5.1)
Sodium: 137 mmol/L (ref 135–145)
Total Bilirubin: 0.8 mg/dL (ref 0.3–1.2)
Total Protein: 7.2 g/dL (ref 6.5–8.1)

## 2020-08-03 MED ORDER — ONDANSETRON HCL 4 MG/2ML IJ SOLN: 4.0000 mg | Freq: Once | INTRAMUSCULAR | Status: DC

## 2020-08-03 MED ORDER — PHENAZOPYRIDINE HCL 200 MG PO TABS
200.0000 mg | ORAL_TABLET | Freq: Three times a day (TID) | ORAL | 1 refills | Status: AC | PRN
Start: 2020-08-03 — End: ?

## 2020-08-03 MED ORDER — LACTATED RINGERS IV BOLUS: 1000.0000 mL | Freq: Once | INTRAVENOUS | Status: DC

## 2020-08-03 MED ORDER — SULFAMETHOXAZOLE-TRIMETHOPRIM 800-160 MG PO TABS: 1.0000 | ORAL_TABLET | Freq: Two times a day (BID) | ORAL | 1 refills | Status: AC

## 2020-08-03 MED ORDER — HYDROMORPHONE HCL 1 MG/ML IJ SOLN
2.0000 mg | Freq: Once | INTRAMUSCULAR | Status: AC
  Administered 2020-08-03: 2 mg via INTRAMUSCULAR
  Filled 2020-08-03: qty 2

## 2020-08-03 MED ORDER — PROMETHAZINE HCL 25 MG PO TABS: 25.0000 mg | ORAL_TABLET | Freq: Four times a day (QID) | ORAL | 2 refills | Status: DC | PRN

## 2020-08-03 MED ORDER — OXYCODONE-ACETAMINOPHEN 5-325 MG PO TABS
2.0000 | ORAL_TABLET | Freq: Four times a day (QID) | ORAL | 0 refills | Status: DC | PRN
Start: 2020-08-03 — End: 2020-08-03

## 2020-08-03 MED ORDER — TAMSULOSIN HCL 0.4 MG PO CAPS
0.4000 mg | ORAL_CAPSULE | Freq: Once | ORAL | Status: AC
  Administered 2020-08-03: 0.4 mg via ORAL
  Filled 2020-08-03: qty 1

## 2020-08-03 MED ORDER — ONDANSETRON 4 MG PO TBDP
8.0000 mg | ORAL_TABLET | Freq: Once | ORAL | Status: AC
  Administered 2020-08-03: 8 mg via ORAL
  Filled 2020-08-03: qty 2

## 2020-08-03 MED ORDER — LACTATED RINGERS IV SOLN: INTRAVENOUS | Status: DC

## 2020-08-03 MED ORDER — OXYCODONE-ACETAMINOPHEN 5-325 MG PO TABS: 2.0000 | ORAL_TABLET | Freq: Four times a day (QID) | ORAL | 0 refills | Status: DC | PRN

## 2020-08-03 MED ORDER — TAMSULOSIN HCL 0.4 MG PO CAPS: 0.4000 mg | ORAL_CAPSULE | Freq: Every day | ORAL | 0 refills | Status: DC

## 2020-08-03 MED ORDER — PHENAZOPYRIDINE HCL 100 MG PO TABS
200.0000 mg | ORAL_TABLET | Freq: Once | ORAL | Status: AC
  Administered 2020-08-03: 200 mg via ORAL
  Filled 2020-08-03: qty 2

## 2020-08-03 MED ORDER — HYDROMORPHONE HCL 1 MG/ML IJ SOLN: 1.0000 mg | Freq: Once | INTRAMUSCULAR | Status: DC

## 2020-08-03 MED ORDER — SULFAMETHOXAZOLE-TRIMETHOPRIM 800-160 MG PO TABS
1.0000 | ORAL_TABLET | Freq: Once | ORAL | Status: AC
  Administered 2020-08-03: 1 via ORAL
  Filled 2020-08-03: qty 1

## 2020-08-03 NOTE — MAU Note (Signed)
Patient signed printed AVS. Placed in file.

## 2020-08-03 NOTE — MAU Note (Signed)
Linda Chen is a 34 y.o. at [redacted]w[redacted]d here in MAU reporting: when she was having a bowel movement she started having pain on the right side. States she has been struggling with constipation while she has been pregnant. Called the after hours nurse and then began vomiting. Vomited 3 times and currently feels nauseous. No bleeding.  Onset of complaint: today  Pain score: 9/10  Vitals:   08/03/20 1529  BP: (!) 88/50  Pulse: 78  Resp: 18  Temp: 98.3 F (36.8 C)  SpO2: 100%     FHT: 159  Lab orders placed from triage: UA

## 2020-08-03 NOTE — Discharge Instructions (Signed)
Kidney Stones  Kidney stones are solid, rock-like deposits that form inside of the kidneys. The kidneys are a pair of organs that make urine. A kidney stone may form in a kidney and move into other parts of the urinary tract, including the tubes that connect the kidneys to the bladder (ureters), the bladder, and the tube that carries urine out of the body (urethra). As the stone moves through these areas, it can cause intense pain and block the flow of urine. Kidney stones are created when high levels of certain minerals are found in the urine. The stones are usually passed out of the body through urination, but in some cases, medical treatment may be needed to remove them. What are the causes? Kidney stones may be caused by:  A condition in which certain glands produce too much parathyroid hormone (primary hyperparathyroidism), which causes too much calcium buildup in the blood.  A buildup of uric acid crystals in the bladder (hyperuricosuria). Uric acid is a chemical that the body produces when you eat certain foods. It usually exits the body in the urine.  Narrowing (stricture) of one or both of the ureters.  A kidney blockage that is present at birth (congenital obstruction).  Past surgery on the kidney or the ureters, such as gastric bypass surgery. What increases the risk? The following factors may make you more likely to develop this condition:  Having had a kidney stone in the past.  Having a family history of kidney stones.  Not drinking enough water.  Eating a diet that is high in protein, salt (sodium), or sugar.  Being overweight or obese. What are the signs or symptoms? Symptoms of a kidney stone may include:  Pain in the side of the abdomen, right below the ribs (flank pain). Pain usually spreads (radiates) to the groin.  Needing to urinate frequently or urgently.  Painful urination.  Blood in the urine (hematuria).  Nausea.  Vomiting.  Fever and chills. How  is this diagnosed? This condition may be diagnosed based on:  Your symptoms and medical history.  A physical exam.  Blood tests.  Urine tests. These may be done before and after the stone passes out of your body through urination.  Imaging tests, such as a CT scan, abdominal X-ray, or ultrasound.  A procedure to examine the inside of the bladder (cystoscopy). How is this treated? Treatment for kidney stones depends on the size, location, and makeup of the stones. Kidney stones will often pass out of the body through urination. You may need to:  Increase your fluid intake to help pass the stone. In some cases, you may be given fluids through an IV and may need to be monitored at the hospital.  Take medicine for pain.  Make changes in your diet to help prevent kidney stones from coming back. Sometimes, medical procedures are needed to remove a kidney stone. This may involve:  A procedure to break up kidney stones using: ? A focused beam of light (laser therapy). ? Shock waves (extracorporeal shock wave lithotripsy).  Surgery to remove kidney stones. This may be needed if you have severe pain or have stones that block your urinary tract. Follow these instructions at home: Medicines  Take over-the-counter and prescription medicines only as told by your health care provider.  Ask your health care provider if the medicine prescribed to you requires you to avoid driving or using heavy machinery. Eating and drinking  Drink enough fluid to keep your urine pale yellow.   You may be instructed to drink at least 8-10 glasses of water each day. This will help you pass the kidney stone.  If directed, change your diet. This may include: ? Limiting how much sodium you eat. ? Eating more fruits and vegetables. ? Limiting how much animal protein--such as red meat, poultry, fish, and eggs--you eat.  Follow instructions from your health care provider about eating or drinking  restrictions. General instructions  Collect urine samples as told by your health care provider. You may need to collect a urine sample: ? 24 hours after you pass the stone. ? 8-12 weeks after passing the kidney stone, and every 6-12 months after that.  Strain your urine every time you urinate, for as long as directed. Use the strainer that your health care provider recommends.  Do not throw out the kidney stone after passing it. Keep the stone so it can be tested by your health care provider. Testing the makeup of your kidney stone may help prevent you from getting kidney stones in the future.  Keep all follow-up visits as told by your health care provider. This is important. You may need follow-up X-rays or ultrasounds to make sure that your stone has passed. How is this prevented? To prevent another kidney stone:  Drink enough fluid to keep your urine pale yellow. This is the best way to prevent kidney stones.  Eat a healthy diet and follow recommendations from your health care provider about foods to avoid. You may be instructed to eat a low-protein diet. Recommendations vary depending on the type of kidney stone that you have.  Maintain a healthy weight. Where to find more information  [employer] (NKF): www.kidney.org  [facility] ([facility]): www.urologyhealth.org Contact a health care provider if:  You have pain that gets worse or does not get better with medicine. Get help right away if:  You have a fever or chills.  You develop severe pain.  You develop new abdominal pain.  You faint.  You are unable to urinate. Summary  Kidney stones are solid, rock-like deposits that form inside of the kidneys.  Kidney stones can cause nausea, vomiting, blood in the urine, abdominal pain, and the urge to urinate frequently.  Treatment for kidney stones depends on the size, location, and makeup of the stones. Kidney stones will often pass out of the body  through urination.  Kidney stones can be prevented by drinking enough fluids, eating a healthy diet, and maintaining a healthy weight. This information is not intended to replace advice given to you by your health care provider. Make sure you discuss any questions you have with your health care provider. Document Revised: 02/13/2019 Document Reviewed: 02/13/2019 [employer] Patient Education  2020 [employer]   Dietary Guidelines to Help Prevent Kidney Stones Kidney stones are deposits of minerals and salts that form inside your kidneys. Your risk of developing kidney stones may be greater depending on your diet, your lifestyle, the medicines you take, and whether you have certain medical conditions. Most people can reduce their chances of developing kidney stones by following the instructions below. Depending on your overall health and the type of kidney stones you tend to develop, your dietitian may give you more specific instructions. What are tips for following this plan? Reading food labels  Choose foods with "no salt added" or "low-salt" labels. Limit your sodium intake to less than 1500 mg per day.  Choose foods with calcium for each meal and snack. Try to eat   about 300 mg of calcium at each meal. Foods that contain 200-500 mg of calcium per serving include: ? 8 oz (237 ml) of milk, fortified nondairy milk, and fortified fruit juice. ? 8 oz (237 ml) of kefir, yogurt, and soy yogurt. ? 4 oz (118 ml) of tofu. ? 1 oz of cheese. ? 1 cup (300 g) of dried figs. ? 1 cup (91 g) of cooked broccoli. ? 1-3 oz can of sardines or mackerel.  Most people need 1000 to 1500 mg of calcium each day. Talk to your dietitian about how much calcium is recommended for you. Shopping  Buy plenty of fresh fruits and vegetables. Most people do not need to avoid fruits and vegetables, even if they contain nutrients that may contribute to kidney stones.  When shopping for convenience foods, choose: ? Whole  pieces of fruit. ? Premade salads with dressing on the side. ? Low-fat fruit and yogurt smoothies.  Avoid buying frozen meals or prepared deli foods.  Look for foods with live cultures, such as yogurt and kefir. Cooking  Do not add salt to food when cooking. Place a salt shaker on the table and allow each person to add his or her own salt to taste.  Use vegetable protein, such as beans, textured vegetable protein (TVP), or tofu instead of meat in pasta, casseroles, and soups. Meal planning   Eat less salt, if told by your dietitian. To do this: ? Avoid eating processed or premade food. ? Avoid eating fast food.  Eat less animal protein, including cheese, meat, poultry, or fish, if told by your dietitian. To do this: ? Limit the number of times you have meat, poultry, fish, or cheese each week. Eat a diet free of meat at least 2 days a week. ? Eat only one serving each day of meat, poultry, fish, or seafood. ? When you prepare animal protein, cut pieces into small portion sizes. For most meat and fish, one serving is about the size of one deck of cards.  Eat at least 5 servings of fresh fruits and vegetables each day. To do this: ? Keep fruits and vegetables on hand for snacks. ? Eat 1 piece of fruit or a handful of berries with breakfast. ? Have a salad and fruit at lunch. ? Have two kinds of vegetables at dinner.  Limit foods that are high in a substance called oxalate. These include: ? Spinach. ? Rhubarb. ? Beets. ? Potato chips and french fries. ? Nuts.  If you regularly take a diuretic medicine, make sure to eat at least 1-2 fruits or vegetables high in potassium each day. These include: ? Avocado. ? Banana. ? Orange, prune, carrot, or tomato juice. ? Baked potato. ? Cabbage. ? Beans and split peas. General instructions   Drink enough fluid to keep your urine clear or pale yellow. This is the most important thing you can do.  Talk to your health care provider and  dietitian about taking daily supplements. Depending on your health and the cause of your kidney stones, you may be advised: ? Not to take supplements with vitamin C. ? To take a calcium supplement. ? To take a daily probiotic supplement. ? To take other supplements such as magnesium, fish oil, or vitamin B6.  Take all medicines and supplements as told by your health care provider.  Limit alcohol intake to no more than 1 drink a day for nonpregnant women and 2 drinks a day for men. One drink equals 12 oz   of beer, 5 oz of wine, or 1 oz of hard liquor.  Lose weight if told by your health care provider. Work with your dietitian to find strategies and an eating plan that works best for you. What foods are not recommended? Limit your intake of the following foods, or as told by your dietitian. Talk to your dietitian about specific foods you should avoid based on the type of kidney stones and your overall health. Grains Breads. Bagels. Rolls. Baked goods. Salted crackers. Cereal. Pasta. Vegetables Spinach. Rhubarb. Beets. Canned vegetables. Pickles. Olives. Meats and other protein foods Nuts. Nut butters. Large portions of meat, poultry, or fish. Salted or cured meats. Deli meats. Hot dogs. Sausages. Dairy Cheese. Beverages Regular soft drinks. Regular vegetable juice. Seasonings and other foods Seasoning blends with salt. Salad dressings. Canned soups. Soy sauce. Ketchup. Barbecue sauce. Canned pasta sauce. Casseroles. Pizza. Lasagna. Frozen meals. Potato chips. French fries. Summary  You can reduce your risk of kidney stones by making changes to your diet.  The most important thing you can do is drink enough fluid. You should drink enough fluid to keep your urine clear or pale yellow.  Ask your health care provider or dietitian how much protein from animal sources you should eat each day, and also how much salt and calcium you should have each day. This information is not intended to  replace advice given to you by your health care provider. Make sure you discuss any questions you have with your health care provider. Document Revised: 01/17/2019 Document Reviewed: 09/07/2016 [employer] Patient Education  2020 [employer]   

## 2020-08-03 NOTE — MAU Provider Note (Signed)
Chief Complaint: Abdominal Pain, Nausea, and Emesis   First Provider Initiated Contact with Patient 08/03/20 1629     SUBJECTIVE HPI: Linda Chen is a 34 y.o. G3P2002 at [redacted]w[redacted]d who presents to Maternity Admissions reporting right sided pain and vomiting x 3 since this morning and sensation of recurrent urge to urinate and have a BM. Was able to have a BM and urge to defecate has resolved.    Location: Right side Quality: Sharp Severity: 9/10 on pain scale Duration: Since this morning Context: [redacted] weeks gestation. Previous Nml IUP on office [place]. Hx Kidney stones Timing: Constant Modifying factors: There are no aggravating or aleviating factors. Associated signs and symptoms: Neg for fever, chills, diarrhea, constipation, dysruria, vaginal bleeding, vaginal discharge, hematuria. Pos for urinary frequency, and urgency, N/V.   Past Medical History:  Diagnosis Date  . Asthma   . Chlamydia    Pt reports treated 12/2012; no further contact with that partner  . Cholecystitis   . Dysrhythmia, cardiac   . GERD (gastroesophageal reflux disease)   . Kidney stone    OB History  Gravida Para Term Preterm AB Living  3 2 2     2   SAB TAB Ectopic Multiple Live Births          2    # Outcome Date GA Lbr Len/2nd Weight Sex Delivery Anes PTL Lv  3 Current           2 Term 08/18/10 [redacted]w[redacted]d   M Vag-Spont   LIV  1 Term 01/19/07 [redacted]w[redacted]d   F Vag-Spont   LIV   Past Surgical History:  Procedure Laterality Date  . CHOLECYSTECTOMY    . KNEE SURGERY     Social History   Socioeconomic History  . Marital status: Single    Spouse name: Not on file  . Number of children: Not on file  . Years of education: Not on file  . Highest education level: Not on file  Occupational History  . Not on file  Tobacco Use  . Smoking status: Never Smoker  . Smokeless tobacco: Never Used  Substance and Sexual Activity  . Alcohol use: Yes  . Drug use: No  . Sexual activity: Not on file  Other Topics Concern  .  Not on file  Social History Narrative  . Not on file   Social Determinants of Health   Financial Resource Strain:   . Difficulty of Paying Living Expenses: Not on file  Food Insecurity:   . Worried About [redacted]w[redacted]d in the Last Year: Not on file  . Ran Out of Food in the Last Year: Not on file  Transportation Needs:   . Lack of Transportation (Medical): Not on file  . Lack of Transportation (Non-Medical): Not on file  Physical Activity:   . Days of Exercise per Week: Not on file  . Minutes of Exercise per Session: Not on file  Stress:   . Feeling of Stress : Not on file  Social Connections:   . Frequency of Communication with Friends and Family: Not on file  . Frequency of Social Gatherings with Friends and Family: Not on file  . Attends Religious Services: Not on file  . Active Member of Clubs or Organizations: Not on file  . Attends Programme researcher, broadcasting/film/video Meetings: Not on file  . Marital Status: Not on file  Intimate Partner Violence:   . Fear of Current or Ex-Partner: Not on file  . Emotionally Abused: Not on  file  . Physically Abused: Not on file  . Sexually Abused: Not on file   Family History  Problem Relation Age of Onset  . Stroke Mother   . Gout Father    No current facility-administered medications on file prior to encounter.   Current Outpatient Medications on File Prior to Encounter  Medication Sig Dispense Refill  . Etonogestrel (IMPLANON [place]) Inject 1 each into the skin once. Placed in 04/16/2016    . hydrOXYzine (ATARAX/VISTARIL) 25 MG tablet Take 1-2 tablets (25-50 mg total) by mouth every 6 (six) hours. 12 tablet 0  . meloxicam (MOBIC) 15 MG tablet Take 1 tablet (15 mg total) by mouth daily as needed for pain. 10 tablet 0  . [DISCONTINUED] albuterol (PROVENTIL HFA;VENTOLIN HFA) 108 (90 Base) MCG/ACT inhaler Inhale 1-2 puffs into the lungs every 6 (six) hours as needed for wheezing or shortness of breath. 1 Inhaler 0  . [DISCONTINUED] fluticasone  (FLONASE) 50 MCG/ACT nasal spray Place 1 spray into both nostrils daily. 16 g 0   Allergies  Allergen Reactions  . Other Anaphylaxis    Mustard    I have reviewed patient's Past Medical Hx, Surgical Hx, Family Hx, Social Hx, medications and allergies.   Review of Systems  Constitutional: Negative for appetite change, chills and fever.  Gastrointestinal: Positive for nausea and vomiting. Negative for abdominal pain, constipation and diarrhea.  Genitourinary: Positive for flank pain, frequency and urgency. Negative for difficulty urinating, dysuria, hematuria, pelvic pain, vaginal bleeding and vaginal discharge.    OBJECTIVE Patient Vitals for the past 24 hrs:  BP Temp Temp src Pulse Resp SpO2 Height Weight  08/03/20 1908 105/62 -- -- -- 17 -- -- --  08/03/20 1529 (!) 88/50 98.3 F (36.8 C) Oral 78 18 100 % -- --  08/03/20 1521 -- -- -- -- -- --  (1.854 m) (!) 148 kg   Constitutional: Well-developed, well-nourished female in moderate distress, pacing, unable to sit still.  Cardiovascular: normal rate Respiratory: normal rate and effort.  GI: Abd soft, non-tender, Fundus non-palpable due to maternal body habitus MS: Extremities nontender, no edema, normal ROM Neurologic: Alert and oriented x 4.  GU: Neg CVAT.  SPECULUM EXAM: Deferred due to location of pain.  FHR 152 by doppler  LAB RESULTS Results for orders placed or performed during the hospital encounter of 08/03/20 (from the past 24 hour(s))  Urinalysis, Routine w reflex microscopic Urine, Clean Catch     Status: Abnormal   Collection Time: 08/03/20  4:31 PM  Result Value Ref Range   Color, Urine AMBER (A) YELLOW   APPearance CLOUDY (A) CLEAR   Specific Gravity, Urine 1.026 1.005 - 1.030   pH 5.0 5.0 - 8.0   Glucose, UA NEGATIVE NEGATIVE mg/dL   Hgb urine dipstick LARGE (A) NEGATIVE   Bilirubin Urine NEGATIVE NEGATIVE   Ketones, ur 5 (A) NEGATIVE mg/dL   Protein, ur 30 (A) NEGATIVE mg/dL   Nitrite NEGATIVE  NEGATIVE   Leukocytes,Ua TRACE (A) NEGATIVE   RBC / HPF >50 (H) 0 - 5 RBC/hpf   WBC, UA 6-10 0 - 5 WBC/hpf   Bacteria, UA RARE (A) NONE SEEN   Squamous Epithelial / LPF 11-20 0 - 5   Mucus PRESENT   CBC with Differential/Platelet     Status: Abnormal   Collection Time: 08/03/20  5:55 PM  Result Value Ref Range   WBC 9.9 4.0 - 10.5 K/uL   RBC 3.84 (L) 3.87 - 5.11 MIL/uL  Hemoglobin 10.9 (L) 12.0 - 15.0 g/dL   HCT 85.4 (L) 36 - 46 %   MCV 89.1 80.0 - 100.0 fL   MCH 28.4 26.0 - 34.0 pg   MCHC 31.9 30.0 - 36.0 g/dL   RDW 62.7 03.5 - 00.9 %   Platelets 256 150 - 400 K/uL   nRBC 0.0 0.0 - 0.2 %   Neutrophils Relative % 82 %   Neutro Abs 8.0 (H) 1.7 - 7.7 K/uL   Lymphocytes Relative 11 %   Lymphs Abs 1.1 0.7 - 4.0 K/uL   Monocytes Relative 5 %   Monocytes Absolute 0.5 0.1 - 1.0 K/uL   Eosinophils Relative 2 %   Eosinophils Absolute 0.2 0.0 - 0.5 K/uL   Basophils Relative 0 %   Basophils Absolute 0.0 0.0 - 0.1 K/uL   Immature Granulocytes 0 %   Abs Immature Granulocytes 0.04 0.00 - 0.07 K/uL  Comprehensive metabolic panel     Status: Abnormal   Collection Time: 08/03/20  5:55 PM  Result Value Ref Range   Sodium 137 135 - 145 mmol/L   Potassium 4.0 3.5 - 5.1 mmol/L   Chloride 104 98 - 111 mmol/L   CO2 24 22 - 32 mmol/L   Glucose, Bld 105 (H) 70 - 99 mg/dL   BUN 9 6 - 20 mg/dL   Creatinine, Ser 3.81 0.44 - 1.00 mg/dL   Calcium 9.4 8.9 - 82.9 mg/dL   Total Protein 7.2 6.5 - 8.1 g/dL   Albumin 3.2 (L) 3.5 - 5.0 g/dL   AST 22 15 - 41 U/L   ALT 25 0 - 44 U/L   Alkaline Phosphatase 43 38 - 126 U/L   Total Bilirubin 0.8 0.3 - 1.2 mg/dL   GFR, Estimated >93 >71 mL/min   Anion gap 9 5 - 15    IMAGING US RENAL  Result Date: 08/03/2020 CLINICAL DATA:  Acute RIGHT flank pain. EXAM: RENAL / URINARY TRACT ULTRASOUND COMPLETE COMPARISON:  CT 10/21/2015 FINDINGS: Right Kidney: Renal measurements: 11.3 x6 x 7.3 = volume: 260 mL. Mild hydronephrosis. 7 mm echogenic calculus in the mid  kidney. Left Kidney: Renal measurements: 11.4 x 5.6 x 7.3 cm = volume: 245 mL. 4 mm calculus lower pole. No hydronephrosis Bladder: Appears normal for degree of bladder distention. Other: None. IMPRESSION: 1. Bilateral single renal calculi. 2. Mild hydronephrosis on the RIGHT Electronically Signed   By: Genevive Bi M.D.   On: 08/03/2020 19:20    MAU COURSE Orders Placed This Encounter  Procedures  . Culture, OB Urine  . US RENAL  . Urinalysis, Routine w reflex microscopic Urine, Clean Catch  . CBC with Differential/Platelet  . Comprehensive metabolic panel  . Apply warm compress   Meds ordered this encounter  Medications  . DISCONTD: ondansetron (ZOFRAN) injection 4 mg  . DISCONTD: lactated ringers infusion  . DISCONTD: HYDROmorphone (DILAUDID) injection 1 mg  . DISCONTD: lactated ringers bolus 1,000 mL  . HYDROmorphone (DILAUDID) injection 2 mg  . ondansetron (ZOFRAN-ODT) disintegrating tablet 8 mg  . phenazopyridine (PYRIDIUM) tablet 200 mg  . tamsulosin (FLOMAX) capsule 0.4 mg  . sulfamethoxazole-trimethoprim (BACTRIM DS) 800-160 MG per tablet 1 tablet  . tamsulosin (FLOMAX) 0.4 MG CAPS capsule    Sig: Take 1 capsule (0.4 mg total) by mouth daily after breakfast.    Dispense:  30 capsule    Refill:  0    Order Specific Question:   Supervising Provider    Answer:   Nettie Elm  L [[id number]]  . promethazine (PHENERGAN) 25 MG tablet    Sig: Take 1 tablet (25 mg total) by mouth every 6 (six) hours as needed for nausea or vomiting.    Dispense:  30 tablet    Refill:  2    Order Specific Question:   Supervising Provider    Answer:   ERVIN, MICHAEL L [[id number]]  . oxyCODONE-acetaminophen (PERCOCET/ROXICET) 5-325 MG tablet    Sig: Take 2 tablets by mouth every 6 (six) hours as needed for severe pain.    Dispense:  30 tablet    Refill:  0    Order Specific Question:   Supervising Provider    Answer:   ERVIN, MICHAEL L [[id number]]  . sulfamethoxazole-trimethoprim (BACTRIM DS) 800-160 MG  tablet    Sig: Take 1 tablet by mouth 2 (two) times daily.    Dispense:  14 tablet    Refill:  1    Order Specific Question:   Supervising Provider    Answer:   ERVIN, MICHAEL L [[id number]]  . phenazopyridine (PYRIDIUM) 200 MG tablet    Sig: Take 1 tablet (200 mg total) by mouth 3 (three) times daily as needed for pain.    Dispense:  12 tablet    Refill:  1    Order Specific Question:   Supervising Provider    Answer:   Alysia PennaERVIN, MICHAEL L [[id number]]   Unable to obtain IV access after multiple attempts. Will try PO/IM meds. If not effective will call IV team to retry IV.   Pain and nausea resolved w/ Meds.   Discussed Hx, labs, US, exam, kidney stone size of 7 mm w/ Dr. Alysia PennaErvin. Agrees w/ POC.    MDM - Bilat kidney stones. Right 7 mm. Left 4 mm. No evidence of obstruction. Pain and and nausea controlled. Rx Flomax. Push fluids. Recommend OP F/U w/ Urology due to recurrent kidney stones. Precautions reviewed.   - Possible UTI. Rx Bactrim. Culture pending. Pyridium for urgency and frequency.    ASSESSMENT 1. Kidney stone complicating pregnancy, second trimester   2. Acute right flank pain   3. Flank pain in pregnant patient     PLAN Discharge home in stable condition per consult w/ Dr. Alysia PennaErvin. Kidney stone and Pyelo precautions  Follow-up Information    Elsie RaSmith, [place], CNM Follow up.   Specialty: Obstetrics and Gynecology Why: Call office in the morning to schedule follow-up appointment. Discuss referral to a Urologist.  Contact information: [address] [place] [phone]        [facility] Follow up.   Specialty: Obstetrics and Gynecology Why: as needed in pregnancy emergencies Contact information: [address] [id number] [place] [phone]             Allergies as of 08/03/2020      Reactions   Other Anaphylaxis   Mustard      Medication List    STOP taking these medications    IMPLANON [place]   meloxicam 15 MG tablet Commonly known as: Mobic     TAKE these medications   hydrOXYzine 25 MG tablet Commonly known as: ATARAX/VISTARIL Take 1-2 tablets (25-50 mg total) by mouth every 6 (six) hours.   oxyCODONE-acetaminophen 5-325 MG tablet Commonly known as: PERCOCET/ROXICET Take 2 tablets by mouth every 6 (six) hours as needed for severe pain.   phenazopyridine 200 MG tablet Commonly known as: Pyridium Take 1 tablet (200 mg total) by mouth 3 (three) times daily  as needed for pain.   promethazine 25 MG tablet Commonly known as: PHENERGAN Take 1 tablet (25 mg total) by mouth every 6 (six) hours as needed for nausea or vomiting.   sulfamethoxazole-trimethoprim 800-160 MG tablet Commonly known as: BACTRIM DS Take 1 tablet by mouth 2 (two) times daily.   tamsulosin 0.4 MG Caps capsule Commonly known as: FLOMAX Take 1 capsule (0.4 mg total) by mouth daily after breakfast.        Katrinka Blazing, [place], CNM 08/03/2020  9:22 PM

## 2020-08-04 LAB — CULTURE, OB URINE: Special Requests: NORMAL

## 2020-08-23 ENCOUNTER — Ambulatory Visit (HOSPITAL_COMMUNITY)
Admission: EM | Admit: 2020-08-23 | Discharge: 2020-08-23 | Disposition: A | Discharge status: Home / Self Care | Payer: Medicaid Other | Attending: Psychiatry | Admitting: Psychiatry

## 2020-08-23 ENCOUNTER — Encounter (HOSPITAL_COMMUNITY): Payer: Self-pay

## 2020-08-23 ENCOUNTER — Other Ambulatory Visit: Payer: Self-pay

## 2020-08-23 DIAGNOSIS — F331 Major depressive disorder, recurrent, moderate: Secondary | ICD-10-CM

## 2020-08-23 DIAGNOSIS — O99342 Other mental disorders complicating pregnancy, second trimester: Secondary | ICD-10-CM | POA: Insufficient documentation

## 2020-08-23 DIAGNOSIS — F32A Depression, unspecified: Secondary | ICD-10-CM | POA: Insufficient documentation

## 2020-08-23 DIAGNOSIS — Z3A00 Weeks of gestation of pregnancy not specified: Secondary | ICD-10-CM | POA: Insufficient documentation
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
## 2020-08-23 NOTE — ED Provider Notes (Signed)
Behavioral Health Urgent Care Medical Screening Exam  Patient Name: Linda Chen MRN: [id number] Date of Evaluation: 08/23/20 Chief Complaint:   Diagnosis:  Final diagnoses:  None    History of Present illness: Linda Chen is a 34 y.o. female.  Presents to behavioral health urgent care as a walk-in citing worsening depression and anxiety.  She denies suicidal or homicidal ideations.  Denies auditory or visual hallucinations.  Rates her depression stems from self-doubt and recanting regrets.  Reports she feels like a bad mother at times.  States she has a good support system.  And was seeking resources for therapy and will consider medication.  Reports she is [redacted] weeks pregnant currently.  Counselor to provide additional outpatient resources.  Support,encouragement and reassurance was provided.  Psychiatric Specialty Exam  Presentation  General Appearance:Appropriate for Environment  Eye Contact:Good  Speech:Clear and Coherent  Speech Volume:Normal  Handedness:No data recorded  Mood and Affect  Mood:Depressed;Anxious  Affect:Congruent   Thought Process  Thought Processes:Coherent  Descriptions of Associations:Intact  Orientation:Full (Time, Place and Person)  Thought Content:Logical  Hallucinations:None  Ideas of Reference:None  Suicidal Thoughts:No  Homicidal Thoughts:No   Sensorium  Memory:Immediate Good;Immediate Poor;Remote Good  Judgment:Good  Insight:Good   Executive Functions  Concentration:Good  Attention Span:Good  Recall:Fair  Fund of Knowledge:Good  Language:Good   Psychomotor Activity  Psychomotor Activity:Normal   Assets  Assets:Social Support;Communication Skills;Talents/Skills   Sleep  Sleep:Fair  Number of hours: No data recorded  Physical Exam: Physical Exam Vitals reviewed.  Neurological:     Mental Status: She is alert.  Psychiatric:        Mood and Affect: Mood normal.    ROS Blood pressure  119/76, pulse 80, temperature 98.6 F (37 C), temperature source Oral, resp. rate 18, height 5\' 11"  (1.803 m), weight (!) 326 lb (147.9 kg), last menstrual period 02/05/2020, SpO2 100 %. Body mass index is 45.47 kg/m.  Musculoskeletal: Strength & Muscle Tone: within normal limits Gait & Station: normal Patient leans: N/A   [facility] MSE Discharge Disposition for Follow up and Recommendations: Based on my evaluation the patient does not appear to have an emergency medical condition and can be discharged with resources and follow up care in outpatient services for Medication Management, Partial Hospitalization Program, Individual Therapy and Group Therapy   02/07/2020, NP 08/23/2020, 7:39 PM
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
## 2020-08-24 NOTE — Discharge Instructions (Addendum)
Take all medications as prescribed. Keep all follow-up appointments as scheduled.  Do not consume alcohol or use illegal drugs while on prescription medications. Report any adverse effects from your medications to your primary care provider promptly.  In the event of recurrent symptoms or worsening symptoms, call 911, a crisis hotline, or go to the nearest emergency department for evaluation.   

## 2020-12-12 ENCOUNTER — Inpatient Hospital Stay (HOSPITAL_COMMUNITY)
Admission: AD | Admit: 2020-12-12 | Discharge: 2020-12-12 | Disposition: A | Discharge status: Home / Self Care | Payer: 59 | Attending: Obstetrics & Gynecology | Admitting: Obstetrics & Gynecology

## 2020-12-12 ENCOUNTER — Encounter (HOSPITAL_COMMUNITY): Payer: Self-pay | Admitting: Obstetrics & Gynecology

## 2020-12-12 ENCOUNTER — Other Ambulatory Visit: Payer: Self-pay

## 2020-12-12 DIAGNOSIS — R102 Pelvic and perineal pain: Secondary | ICD-10-CM | POA: Diagnosis not present

## 2020-12-12 DIAGNOSIS — Z3A35 35 weeks gestation of pregnancy: Secondary | ICD-10-CM | POA: Diagnosis not present

## 2020-12-12 DIAGNOSIS — O26893 Other specified pregnancy related conditions, third trimester: Secondary | ICD-10-CM | POA: Diagnosis not present

## 2020-12-12 DIAGNOSIS — R030 Elevated blood-pressure reading, without diagnosis of hypertension: Secondary | ICD-10-CM

## 2020-12-12 DIAGNOSIS — O09523 Supervision of elderly multigravida, third trimester: Secondary | ICD-10-CM | POA: Diagnosis not present

## 2020-12-12 DIAGNOSIS — Z0371 Encounter for suspected problem with amniotic cavity and membrane ruled out: Secondary | ICD-10-CM | POA: Diagnosis present

## 2020-12-12 LAB — CBC
HCT: 34.4 % — ABNORMAL LOW (ref 36.0–46.0)
Hemoglobin: 11 g/dL — ABNORMAL LOW (ref 12.0–15.0)
MCH: 28.4 pg (ref 26.0–34.0)
MCHC: 32 g/dL (ref 30.0–36.0)
MCV: 88.7 fL (ref 80.0–100.0)
Platelets: 222 10*3/uL (ref 150–400)
RBC: 3.88 MIL/uL (ref 3.87–5.11)
RDW: 14.1 % (ref 11.5–15.5)
WBC: 8.2 10*3/uL (ref 4.0–10.5)
nRBC: 0 % (ref 0.0–0.2)

## 2020-12-12 LAB — PROTEIN / CREATININE RATIO, URINE
Creatinine, Urine: 270.73 mg/dL
Protein Creatinine Ratio: 0.12 mg/mg{Cre} (ref 0.00–0.15)
Total Protein, Urine: 32 mg/dL

## 2020-12-12 LAB — COMPREHENSIVE METABOLIC PANEL
ALT: 23 U/L (ref 0–44)
AST: 21 U/L (ref 15–41)
Albumin: 2.9 g/dL — ABNORMAL LOW (ref 3.5–5.0)
Alkaline Phosphatase: 69 U/L (ref 38–126)
Anion gap: 10 (ref 5–15)
BUN: 9 mg/dL (ref 6–20)
CO2: 24 mmol/L (ref 22–32)
Calcium: 9 mg/dL (ref 8.9–10.3)
Chloride: 102 mmol/L (ref 98–111)
Creatinine, Ser: 0.95 mg/dL (ref 0.44–1.00)
GFR, Estimated: >60 mL/min (ref >60)
Glucose, Bld: 110 mg/dL — ABNORMAL HIGH (ref 70–99)
Potassium: 3.9 mmol/L (ref 3.5–5.1)
Sodium: 136 mmol/L (ref 135–145)
Total Bilirubin: 0.6 mg/dL (ref 0.3–1.2)
Total Protein: 6.5 g/dL (ref 6.5–8.1)

## 2020-12-12 MED ORDER — MORPHINE SULFATE (PF) 4 MG/ML IV SOLN
4.0000 mg | Freq: Once | INTRAVENOUS | Status: AC
  Administered 2020-12-12: 4 mg via INTRAMUSCULAR
  Filled 2020-12-12: qty 1

## 2020-12-12 MED ORDER — CYCLOBENZAPRINE HCL 5 MG PO TABS
10.0000 mg | ORAL_TABLET | Freq: Once | ORAL | Status: AC
  Administered 2020-12-12: 10 mg via ORAL
  Filled 2020-12-12: qty 2

## 2020-12-12 MED ORDER — LIDOCAINE VISCOUS HCL 2 % MT SOLN
15.0000 mL | Freq: Once | OROMUCOSAL | Status: DC
  Filled 2020-12-12: qty 15

## 2020-12-12 MED ORDER — ONDANSETRON 4 MG PO TBDP
8.0000 mg | ORAL_TABLET | Freq: Once | ORAL | Status: AC
  Administered 2020-12-12: 8 mg via ORAL
  Filled 2020-12-12: qty 2

## 2020-12-12 MED ORDER — ALUM & MAG HYDROXIDE-SIMETH 200-200-20 MG/5ML PO SUSP
30.0000 mL | Freq: Once | ORAL | Status: DC
  Filled 2020-12-12: qty 30

## 2020-12-12 MED ORDER — CYCLOBENZAPRINE HCL 10 MG PO TABS: 10.0000 mg | ORAL_TABLET | Freq: Three times a day (TID) | ORAL | 0 refills | Status: AC | PRN

## 2020-12-12 MED ORDER — ACETAMINOPHEN 500 MG PO TABS
1000.0000 mg | ORAL_TABLET | Freq: Once | ORAL | Status: DC
  Filled 2020-12-12: qty 2

## 2020-12-12 NOTE — MAU Provider Note (Signed)
Chief Complaint: Rupture of Membranes and Back Pain   Event Date/Time   First Provider Initiated Contact with Patient 12/12/20 1642      SUBJECTIVE HPI: Linda Chen is a 35 y.o. [id number] who presents to maternity admissions with complaints of pelvic pressure and leaking of fluid that occurred at 1400 today. She reports riding in the car and felt a gush of fluid. She is unable to identify the color and amount of fluid. She reports not being able to dilate with previous pregnancies, and feels she may be in labor.  No bleeding.   Past Medical History:  Diagnosis Date  . Asthma   . Chlamydia    Pt reports treated 12/2012; no further contact with that partner  . Cholecystitis   . Dysrhythmia, cardiac   . GERD (gastroesophageal reflux disease)   . Kidney stone    Past Surgical History:  Procedure Laterality Date  . CHOLECYSTECTOMY    . KNEE SURGERY     Social History   Socioeconomic History  . Marital status: Single    Spouse name: Not on file  . Number of children: Not on file  . Years of education: Not on file  . Highest education level: Not on file  Occupational History  . Not on file  Tobacco Use  . Smoking status: Never Smoker  . Smokeless tobacco: Never Used  Substance and Sexual Activity  . Alcohol use: Not Currently  . Drug use: No  . Sexual activity: Not Currently    Birth control/protection: Implant  Other Topics Concern  . Not on file  Social History Narrative  . Not on file   Social Determinants of Health   Financial Resource Strain: Not on file  Food Insecurity: Not on file  Transportation Needs: Not on file  Physical Activity: Not on file  Stress: Not on file  Social Connections: Not on file  Intimate Partner Violence: Not on file   No current facility-administered medications on file prior to encounter.   Current Outpatient Medications on File Prior to Encounter  Medication Sig Dispense Refill  . Blood Glucose Calibration (ACCU-CHEK ACTIVE  GLUCOSE CONT VI) by In Vitro route.    . Prenatal Vit-Fe Fumarate-FA (PRENATAL MULTIVITAMIN) TABS tablet Take 1 tablet by mouth daily at 12 noon.    . phenazopyridine (PYRIDIUM) 200 MG tablet Take 1 tablet (200 mg total) by mouth 3 (three) times daily as needed for pain. 12 tablet 1  . sulfamethoxazole-trimethoprim (BACTRIM DS) 800-160 MG tablet Take 1 tablet by mouth 2 (two) times daily. 14 tablet 1  . [DISCONTINUED] albuterol (PROVENTIL HFA;VENTOLIN HFA) 108 (90 Base) MCG/ACT inhaler Inhale 1-2 puffs into the lungs every 6 (six) hours as needed for wheezing or shortness of breath. 1 Inhaler 0  . [DISCONTINUED] fluticasone (FLONASE) 50 MCG/ACT nasal spray Place 1 spray into both nostrils daily. 16 g 0   Allergies  Allergen Reactions  . Other Anaphylaxis    Mustard    Recent Results (from the past 2160 hour(s))  Protein / creatinine ratio, urine     Status: None   Collection Time: 12/12/20  6:10 PM  Result Value Ref Range   Creatinine, Urine 270.73 mg/dL   Total Protein, Urine 32 mg/dL    Comment: NO NORMAL RANGE ESTABLISHED FOR THIS TEST   Protein Creatinine Ratio 0.12 0.00 - 0.15 mg/mg[Cre]    Comment: Performed at [facility], [address] [place], [place] [address]  CBC     Status:  Abnormal   Collection Time: 12/12/20  7:23 PM  Result Value Ref Range   WBC 8.2 4.0 - 10.5 K/uL   RBC 3.88 3.87 - 5.11 MIL/uL   Hemoglobin 11.0 (L) 12.0 - 15.0 g/dL   HCT 50.2 (L) 77.4 - 12.8 %   MCV 88.7 80.0 - 100.0 fL   MCH 28.4 26.0 - 34.0 pg   MCHC 32.0 30.0 - 36.0 g/dL   RDW 78.6 76.7 - 20.9 %   Platelets 222 150 - 400 K/uL   nRBC 0.0 0.0 - 0.2 %    Comment: Performed at [facility], [address] [place], [place] [address]  Comprehensive metabolic panel     Status: Abnormal   Collection Time: 12/12/20  7:23 PM  Result Value Ref Range   Sodium 136 135 - 145 mmol/L   Potassium 3.9 3.5 - 5.1 mmol/L   Chloride 102 98 - 111 mmol/L   CO2 24 22 - 32 mmol/L   Glucose, Bld  110 (H) 70 - 99 mg/dL    Comment: Glucose reference range applies only to samples taken after fasting for at least 8 hours.   BUN 9 6 - 20 mg/dL   Creatinine, Ser 2.83 0.44 - 1.00 mg/dL   Calcium 9.0 8.9 - 66.2 mg/dL   Total Protein 6.5 6.5 - 8.1 g/dL   Albumin 2.9 (L) 3.5 - 5.0 g/dL   AST 21 15 - 41 U/L   ALT 23 0 - 44 U/L   Alkaline Phosphatase 69 38 - 126 U/L   Total Bilirubin 0.6 0.3 - 1.2 mg/dL   GFR, Estimated >94 >76 mL/min    Comment: (NOTE) Calculated using the CKD-EPI Creatinine Equation (2021)    Anion gap 10 5 - 15    Comment: Performed at [facility], [address], [place], [place] [address]    ROS:  Review of Systems  Constitutional: Negative for fever.  Genitourinary: Positive for pelvic pain. Negative for vaginal bleeding and vaginal discharge.  Psychiatric/Behavioral: Hallucinations:     I have reviewed patient's Past Medical Hx, Surgical Hx, Family Hx, Social Hx, medications and allergies.   Physical Exam   Patient Vitals for the past 24 hrs:  BP Temp Temp src Pulse Resp SpO2 Height Weight  12/12/20 1804 (!) 142/84 -- -- 78 -- -- -- --  12/12/20 1603 130/65 98.7 F (37.1 C) Oral 79 20 99 % -- --  12/12/20 1558 -- -- -- -- -- -- 6\' 1"  (1.854 m) (!) 152 kg   Physical Exam Constitutional:      General: She is not in acute distress.    Appearance: Normal appearance. She is obese. She is not ill-appearing, toxic-appearing or diaphoretic.  Abdominal:     Palpations: Abdomen is soft.  Genitourinary:    Comments: Vagina - Small amount of white vaginal discharge, no odor, no pooling of fluid  Cervix - No contact bleeding, no active bleeding  Bimanual exam: Dilation: 1 Effacement (%): 50 Station: -3 Exam by:: [email or web address], NP Chaperone present for exam.  Neurological:     Mental Status: She is alert.    Fetal Tracing: Baseline: 130 bpm Variability: Moderate  Accelerations: 15x15 Decelerations: None Toco: None  MDM  Patient reports  nausea/ vomiting after morphine given. She declines antiemetics  She reports upper abdominal pain since vomiting : BP elevated. PIH labs ordered  Patient declines tylenol & Gi cocktail, requesting to eat. Elevated BP while patient was laying on his  side. No further elevated BPs with normal PIH labs.  Fern slide negative, vagina dry on exam/ no pooling.   ASSESSMENT MSE Complete  Problem List Items Addressed This Visit   None   Visit Diagnoses    Pelvic pain in pregnancy, antepartum, third trimester    -  Primary   [redacted] weeks gestation of pregnancy       Relevant Orders   Discharge patient (Completed)   Encounter for suspected premature rupture of amniotic membranes, with rupture of membranes not found       Relevant Orders   Discharge patient (Completed)   Elevated BP without diagnosis of hypertension       Relevant Orders   Discharge patient (Completed)      PLAN  Discharge home with labor precautions PIH precautions.  Flexeril Rx F/u with OB as scheduled  Return to MAU if symptoms worsen   Shavaughn Seidl, Harolyn Rutherford, NP 12/12/2020 4:51 PM

## 2020-12-12 NOTE — MAU Note (Signed)
[facility] phlebotomy notified to request status on blood draw. Phlebotomist informed she would be on her way.

## 2020-12-12 NOTE — MAU Note (Signed)
Presents with c/o LOF since approx 1530 this afternoon, reports fluid is clear.  Denies VB.  Endorses +FM today, reports fetus hasn't moved in last hour.  Also lower back pain.

## 2023-05-22 ENCOUNTER — Other Ambulatory Visit: Payer: Self-pay

## 2023-05-22 ENCOUNTER — Encounter (HOSPITAL_COMMUNITY): Payer: Self-pay

## 2023-05-22 ENCOUNTER — Emergency Department (HOSPITAL_COMMUNITY)
Admission: EM | Admit: 2023-05-22 | Discharge: 2023-05-22 | Disposition: A | Discharge status: Home / Self Care | Payer: Medicaid Other | Source: Home / Self Care | Attending: Student | Admitting: Student

## 2023-05-22 DIAGNOSIS — L309 Dermatitis, unspecified: Secondary | ICD-10-CM

## 2023-05-22 DIAGNOSIS — L239 Allergic contact dermatitis, unspecified cause: Secondary | ICD-10-CM | POA: Insufficient documentation

## 2023-05-22 DIAGNOSIS — L249 Irritant contact dermatitis, unspecified cause: Secondary | ICD-10-CM | POA: Diagnosis not present

## 2023-05-22 DIAGNOSIS — J45909 Unspecified asthma, uncomplicated: Secondary | ICD-10-CM | POA: Insufficient documentation

## 2023-05-22 DIAGNOSIS — R21 Rash and other nonspecific skin eruption: Secondary | ICD-10-CM | POA: Diagnosis present

## 2023-05-22 HISTORY — DX: Dermatitis, unspecified: L30.9

## 2023-05-22 MED ORDER — DEXAMETHASONE SODIUM PHOSPHATE 10 MG/ML IJ SOLN
10.0000 mg | Freq: Once | INTRAMUSCULAR | Status: AC
  Administered 2023-05-22: 10 mg via INTRAMUSCULAR
  Filled 2023-05-22: qty 1

## 2023-05-22 NOTE — ED Triage Notes (Signed)
Pt c/o rash on arms bilat and upper legs bilat started yesterday. Pt states she already takes meds for itching. Pt states she was already being treated for scabies. Pt has red raised bumps on arms. PT has dried patches of skin all over arms.

## 2023-05-22 NOTE — Discharge Instructions (Signed)
It was a pleasure caring for you today.  You received a shot of steroids today which should help with your symptoms. Please follow up with dermatology tomorrow.  Seek emergency care if experiencing any new or worsening symptoms.

## 2023-05-22 NOTE — ED Provider Notes (Signed)
[place] EMERGENCY DEPARTMENT AT [facility] Provider Note   CSN: [id number] Arrival date & time: 05/22/23  0741     History  Chief Complaint  Patient presents with   Rash    Linda Chen is a 37 y.o. female with PMHx eczema, GERD, Asthma who presents to ED concerned for diffuse rash. Patient has had this diffuse rash over the past year and has been following with dermatologist who has done at least 10 skin biopsies all which were positive for eczema. On patient's last visit 2 weeks ago, dermatologist provided patient with Permethrin thinking that rash could be scabies related. No one in patient's household has similar symptoms. Patient was complaint with scabies treatment but rash has not resolved. Patient now concerned because some small vesicular eruptions happened last night. Came to ED since dermatology office is closed. Patient does endorse multiple new creams and oral medications for eczema over the past year. No new antibiotic use. Patient concerned because she is scheduled for gastric bypass surgery in 2 days.   No new detergents/soap. No exposure to dogs/cats/ other allergens. No new foods. No fever, chest pain, dyspnea, abdominal pain, nausea, vomiting, diarrhea.   Rash      Home Medications Prior to Admission medications   Medication Sig Start Date End Date Taking? Authorizing Provider  Blood Glucose Calibration (ACCU-CHEK ACTIVE GLUCOSE CONT VI) by In Vitro route.    [provider]  cyclobenzaprine (FLEXERIL) 10 MG tablet Take 1 tablet (10 mg total) by mouth 3 (three) times daily as needed for muscle spasms. 12/12/20   Rasch, Victorino Dike I, NP  phenazopyridine (PYRIDIUM) 200 MG tablet Take 1 tablet (200 mg total) by mouth 3 (three) times daily as needed for pain. 08/03/20   Katrinka Blazing, [place], CNM  Prenatal Vit-Fe Fumarate-FA (PRENATAL MULTIVITAMIN) TABS tablet Take 1 tablet by mouth daily at 12 noon.    [provider]   sulfamethoxazole-trimethoprim (BACTRIM DS) 800-160 MG tablet Take 1 tablet by mouth 2 (two) times daily. 08/03/20   Katrinka Blazing, [place], CNM  albuterol (PROVENTIL HFA;VENTOLIN HFA) 108 (90 Base) MCG/ACT inhaler Inhale 1-2 puffs into the lungs every 6 (six) hours as needed for wheezing or shortness of breath. 09/29/18 08/18/19  Liberty Handy, PA-C  fluticasone (FLONASE) 50 MCG/ACT nasal spray Place 1 spray into both nostrils daily. 11/11/18 08/18/19  Aviva Kluver B, PA-C      Allergies    Other    Review of Systems   Review of Systems  Skin:  Positive for rash.    Physical Exam Updated Vital Signs BP 111/73   Pulse 62   Temp 97.9 F (36.6 C) (Oral)   Resp 17   Ht 6\' 1"  (1.854 m)   Wt (!) 152 kg   SpO2 100%   BMI 44.21 kg/m  Physical Exam Vitals and nursing note reviewed.  Constitutional:      General: She is not in acute distress.    Appearance: She is not ill-appearing or toxic-appearing.  HENT:     Head: Normocephalic and atraumatic.     Mouth/Throat:     Comments: No oral lesions appreciated Eyes:     General: No scleral icterus.       Right eye: No discharge.        Left eye: No discharge.     Conjunctiva/sclera: Conjunctivae normal.  Cardiovascular:     Rate and Rhythm: Normal rate.  Pulmonary:     Effort: Pulmonary effort is normal.  Abdominal:  General: Abdomen is flat.  Skin:    General: Skin is warm and dry.     Comments: Raised, dry scattered patches on BL upper and lower extremities. tiny (2mm) vesicles scattered diffusely on extremities. Negative Nikolsky sign. Rash spares webbing of fingers, palms, and oral mucosa.   Neurological:     General: No focal deficit present.     Mental Status: She is alert. Mental status is at baseline.  Psychiatric:        Mood and Affect: Mood normal.        Behavior: Behavior normal.     ED Results / Procedures / Treatments   Labs (all labs ordered are listed, but only abnormal results are displayed) Labs  Reviewed - No data to display  EKG None  Radiology No results found.  Procedures Procedures    Medications Ordered in ED Medications  dexamethasone (DECADRON) injection 10 mg (10 mg Intramuscular Given 05/22/23 1211)    ED Course/ Medical Decision Making/ A&P                                 Medical Decision Making Risk Prescription drug management.   This patient presents to the ED for concern of rash, this involves an extensive number of treatment options, and is a complaint that carries with it a high risk of complications and morbidity.  The differential diagnosis includes irritant contact dermatitis, DRESS, atopic dermatitis, anaphylaxis, SJS/TEN   Co morbidities that complicate the patient evaluation  eczema   Problem List / ED Course / Critical interventions / Medication management  Patient presents to ED concerned for diffuse rash. Patient has been following with dermatology over the past year with multiple skin biopsies all positive for eczema. Patient concerned because a few tiny scattered vesicular eruptions happened yesterday. Patient denying any other infectious symptoms today such as fever, cough, nausea, vomiting.  Given that patient has been applying Permethrin cream that coincides with rash - it appears that patient is suffering from irritant dermatitis.  Patient initially refusing steroids because she has a gastric bypass surgery scheduled in 2 days and does not want surgery to be cancelled.  Patient ended up calling nurse at the surgery center who states that patient can receive steroid  and they would follow-up with her on tomorrow. Provided patient with a dose of Decadron. Lab results from last appointment reviewed. CBC/BMP from 05/06/23 without anemia, leukocytosis, electrolyte abnormality, or elevated kidney function panel. I have reviewed the patients home medicines and have made adjustments as needed Patient afebrile with stable vitals.  Provided with  return precautions.  Discharged in good condition.  Ddx: these are considered less likely due to history of present illness and physical exam -irritant contact dermatitis: patient without irritant exposure -DRESS: patient afebrile, vitals stable, rash is not acute -anaphylaxis: O2 100%, no respiratory complaint -SJS/TEN: negative Nikolsky sign -[place] Spotted Fever/Lyme Disease: no hx tick bite or fever   Social Determinants of Health:  none          Final Clinical Impression(s) / ED Diagnoses Final diagnoses:  Eczema, unspecified type  Irritant dermatitis    Rx / DC Orders ED Discharge Orders     None         Dorthy Cooler, [place] 05/22/23 1235    Glendora Score, MD 05/22/23 1812

## 2024-09-13 ENCOUNTER — Encounter (HOSPITAL_BASED_OUTPATIENT_CLINIC_OR_DEPARTMENT_OTHER): Payer: Self-pay | Admitting: Emergency Medicine

## 2024-09-13 ENCOUNTER — Other Ambulatory Visit: Payer: Self-pay

## 2024-09-13 ENCOUNTER — Emergency Department (HOSPITAL_BASED_OUTPATIENT_CLINIC_OR_DEPARTMENT_OTHER)
Admission: EM | Admit: 2024-09-13 | Discharge: 2024-09-13 | Disposition: A | Discharge status: Home / Self Care | Attending: Emergency Medicine | Admitting: Emergency Medicine

## 2024-09-13 ENCOUNTER — Emergency Department (INDEPENDENT_AMBULATORY_CARE_PROVIDER_SITE_OTHER)

## 2024-09-13 DIAGNOSIS — R479 Unspecified speech disturbances: Secondary | ICD-10-CM

## 2024-09-13 DIAGNOSIS — J029 Acute pharyngitis, unspecified: Secondary | ICD-10-CM | POA: Insufficient documentation

## 2024-09-13 LAB — RESP PANEL BY RT-PCR (RSV, FLU A&B, COVID)  RVPGX2
Influenza A by PCR: NEGATIVE
Influenza B by PCR: NEGATIVE
Resp Syncytial Virus by PCR: NEGATIVE
SARS Coronavirus 2 by RT PCR: NEGATIVE

## 2024-09-13 LAB — CBC WITH DIFFERENTIAL/PLATELET
Abs Immature Granulocytes: 0.01 K/uL (ref 0.00–0.07)
Basophils Absolute: 0 K/uL (ref 0.0–0.1)
Basophils Relative: 1 %
Eosinophils Absolute: 0.4 K/uL (ref 0.0–0.5)
Eosinophils Relative: 9 %
HCT: 36.4 % (ref 36.0–46.0)
Hemoglobin: 11.8 g/dL — ABNORMAL LOW (ref 12.0–15.0)
Immature Granulocytes: 0 %
Lymphocytes Relative: 40 %
Lymphs Abs: 1.7 K/uL (ref 0.7–4.0)
MCH: 29.1 pg (ref 26.0–34.0)
MCHC: 32.4 g/dL (ref 30.0–36.0)
MCV: 89.9 fL (ref 80.0–100.0)
Monocytes Absolute: 0.4 K/uL (ref 0.1–1.0)
Monocytes Relative: 11 %
Neutro Abs: 1.6 K/uL — ABNORMAL LOW (ref 1.7–7.7)
Neutrophils Relative %: 39 %
Platelets: 260 K/uL (ref 150–400)
RBC: 4.05 MIL/uL (ref 3.87–5.11)
RDW: 13 % (ref 11.5–15.5)
WBC: 4.1 K/uL (ref 4.0–10.5)
nRBC: 0 % (ref 0.0–0.2)

## 2024-09-13 LAB — BASIC METABOLIC PANEL WITH GFR
Anion gap: 9 (ref 5–15)
BUN: 16 mg/dL (ref 6–20)
CO2: 25 mmol/L (ref 22–32)
Calcium: 9.2 mg/dL (ref 8.9–10.3)
Chloride: 107 mmol/L (ref 98–111)
Creatinine, Ser: 1.02 mg/dL — ABNORMAL HIGH (ref 0.44–1.00)
GFR, Estimated: >60 mL/min (ref >60)
Glucose, Bld: 181 mg/dL — ABNORMAL HIGH (ref 70–99)
Potassium: 4.4 mmol/L (ref 3.5–5.1)
Sodium: 140 mmol/L (ref 135–145)

## 2024-09-13 LAB — HCG, SERUM, QUALITATIVE: Preg, Serum: NEGATIVE

## 2024-09-13 LAB — GROUP A STREP BY PCR: Group A Strep by PCR: NOT DETECTED

## 2024-09-13 MED ORDER — IOHEXOL 300 MG/ML  SOLN
100.0000 mL | Freq: Once | INTRAMUSCULAR | Status: AC | PRN
  Administered 2024-09-13: 75 mL via INTRAVENOUS

## 2024-09-13 MED ORDER — LIDOCAINE VISCOUS HCL 2 % MT SOLN
15.0000 mL | Freq: Once | OROMUCOSAL | Status: AC
  Administered 2024-09-13: 15 mL via OROMUCOSAL
  Filled 2024-09-13: qty 15

## 2024-09-13 MED ORDER — AMOXICILLIN-POT CLAVULANATE 875-125 MG PO TABS: 1.0000 | ORAL_TABLET | Freq: Two times a day (BID) | ORAL | 0 refills | Status: DC

## 2024-09-13 MED ORDER — AMOXICILLIN-POT CLAVULANATE 875-125 MG PO TABS: 1.0000 | ORAL_TABLET | Freq: Two times a day (BID) | ORAL | 0 refills | Status: AC

## 2024-09-13 MED ORDER — DEXAMETHASONE SOD PHOSPHATE PF 10 MG/ML IJ SOLN
10.0000 mg | Freq: Once | INTRAMUSCULAR | Status: AC
  Administered 2024-09-13: 10 mg via INTRAVENOUS

## 2024-09-13 MED ORDER — KETOROLAC TROMETHAMINE 15 MG/ML IJ SOLN
15.0000 mg | Freq: Once | INTRAMUSCULAR | Status: AC
  Administered 2024-09-13: 15 mg via INTRAVENOUS
  Filled 2024-09-13: qty 1

## 2024-09-13 NOTE — ED Triage Notes (Signed)
 Reports difficulty speaking d/t sore throat. Handling secretions. Pain started yesterday.

## 2024-09-13 NOTE — ED Provider Notes (Signed)
 [place] EMERGENCY DEPARTMENT AT [facility] Provider Note   CSN: [id number] Arrival date & time: 09/13/24  9293     Patient presents with: Sore Throat   Linda Chen is a 38 y.o. female.   38 year old female history of elevated BMI who presents to the emergency department throat pain.  Over the past week has had a sore throat.  Woke up last night because she felt like she was having some difficulty breathing.  Is having difficulty swallowing.  Does not want to speak due to the pain.  Had a low-grade fever of 101.  Also has had some congestion and right ear pain.  No cough.       Prior to Admission medications   Medication Sig Start Date End Date Taking? Authorizing Provider  amoxicillin-clavulanate (AUGMENTIN) 875-125 MG tablet Take 1 tablet by mouth every 12 (twelve) hours. 09/13/24   Yolande Lamar BROCKS, MD  Blood Glucose Calibration (ACCU-CHEK ACTIVE GLUCOSE CONT VI) by In Vitro route.    [provider]  cyclobenzaprine  (FLEXERIL ) 10 MG tablet Take 1 tablet (10 mg total) by mouth 3 (three) times daily as needed for muscle spasms. 12/12/20   Rasch, Delon I, NP  phenazopyridine  (PYRIDIUM ) 200 MG tablet Take 1 tablet (200 mg total) by mouth 3 (three) times daily as needed for pain. 08/03/20   Smith, [place] , CNM  Prenatal Vit-Fe Fumarate-FA (PRENATAL MULTIVITAMIN) TABS tablet Take 1 tablet by mouth daily at 12 noon.    [provider]  sulfamethoxazole -trimethoprim  (BACTRIM  DS) 800-160 MG tablet Take 1 tablet by mouth 2 (two) times daily. 08/03/20   Smith, Virginia , CNM  albuterol  (PROVENTIL  HFA;VENTOLIN  HFA) 108 (90 Base) MCG/ACT inhaler Inhale 1-2 puffs into the lungs every 6 (six) hours as needed for wheezing or shortness of breath. 09/29/18 08/18/19  Norris Will PARAS, PA-C  fluticasone  (FLONASE ) 50 MCG/ACT nasal spray Place 1 spray into both nostrils daily. 11/11/18 08/18/19  Jason Fish B, PA-C    Allergies: Other    Review of  Systems  Updated Vital Signs BP (!) 154/128   Pulse (!) 57   Temp 98.1 F (36.7 C)   Resp 18   SpO2 99%   Physical Exam Constitutional:      Appearance: She is well-developed and normal weight.     Comments: Tolerating her secretions.  Refusing to talk due to the pain and typing on her phone.  HENT:     Head: Normocephalic and atraumatic.     Right Ear: Tympanic membrane and ear canal normal.     Left Ear: Tympanic membrane and ear canal normal.     Nose: Congestion present.     Mouth/Throat:     Mouth: Mucous membranes are dry. No oral lesions.     Pharynx: Pharyngeal swelling and posterior oropharyngeal erythema present. No oropharyngeal exudate or uvula swelling.  Eyes:     Extraocular Movements: Extraocular movements intact.     Conjunctiva/sclera: Conjunctivae normal.     Pupils: Pupils are equal, round, and reactive to light.  Neck:     Comments: BMI limits evaluation for lymphadenopathy Cardiovascular:     Rate and Rhythm: Normal rate and regular rhythm.  Pulmonary:     Effort: Pulmonary effort is normal.     Breath sounds: Normal breath sounds. No stridor.  Musculoskeletal:     Cervical back: Normal range of motion and neck supple.  Neurological:     Mental Status: She is alert.     (all labs ordered  are listed, but only abnormal results are displayed) Labs Reviewed  CBC WITH DIFFERENTIAL/PLATELET - Abnormal; Notable for the following components:      Result Value   Hemoglobin 11.8 (*)    Neutro Abs 1.6 (*)    All other components within normal limits  BASIC METABOLIC PANEL WITH GFR - Abnormal; Notable for the following components:   Glucose, Bld 181 (*)    Creatinine, Ser 1.02 (*)    All other components within normal limits  RESP PANEL BY RT-PCR (RSV, FLU A&B, COVID)  [id number]  GROUP A STREP BY PCR  HCG, SERUM, QUALITATIVE    EKG: None  Radiology: CT Soft Tissue Neck W Contrast Result Date: 09/13/2024 CLINICAL DATA:  Sore throat, difficulty  speaking EXAM: CT NECK WITH CONTRAST TECHNIQUE: Multidetector CT imaging of the neck was performed using the standard protocol following the bolus administration of intravenous contrast. RADIATION DOSE REDUCTION: This exam was performed according to the departmental dose-optimization program which includes automated exposure control, adjustment of the mA and/or kV according to patient size and/or use of iterative reconstruction technique. CONTRAST:  75mL OMNIPAQUE  IOHEXOL  300 MG/ML  SOLN COMPARISON:  None Available. FINDINGS: Pharynx: The nasopharynx, oropharynx and hypopharynx are normal. The epiglottis is normal. Oral cavity/floor of mouth: Normal Larynx: Normal Salivary glands: The parotid and submandibular glands are normal Thyroid: Normal Lymph nodes: No adenopathy Vascular: No significant abnormality Limited intracranial: No significant abnormality Visualized orbits: No significant abnormality Mastoids and visualized paranasal sinuses: No significant abnormality Skeleton: No significant abnormality Upper chest: No significant abnormality Other: None IMPRESSION: No peritonsillar abscess or other significant abnormality Electronically Signed   By: Nancyann Burns M.D.   On: 09/13/2024 09:38     Procedures   Medications Ordered in the ED  dexamethasone  (DECADRON ) injection 10 mg (10 mg Intravenous Given 09/13/24 0804)  ketorolac  (TORADOL ) 15 MG/ML injection 15 mg (15 mg Intravenous Given 09/13/24 0803)  lidocaine  (XYLOCAINE ) 2 % viscous mouth solution 15 mL (15 mLs Mouth/Throat Given 09/13/24 0804)  iohexol  (OMNIPAQUE ) 300 MG/ML solution 100 mL (75 mLs Intravenous Contrast Given 09/13/24 0859)                                    Medical Decision Making Amount and/or Complexity of Data Reviewed Labs: ordered. Radiology: ordered.  Risk Prescription drug management.   Linda Chen is a 38 year old female history of elevated BMI presents emergency department with throat pain  Initial Ddx:   Viral pharyngitis, bacterial pharyngitis, peritonsillar abscess, RPA  MDM/Course:  Patient presents emergency department with sore throat.  Using a talk because of the pain.  On exam appears to be tolerating her secretions.  She is not in acute distress.  I do not appreciate any stridor.  Exam somewhat limited due to her habitus did not appreciate any signs of deep space infection now.  Was given Decadron  and Toradol  and upon re-evaluation was still complaining of severe pain that was limiting her ability to talk so CT of the neck was obtained that did not show acute findings.  Suspect that she likely has viral pharyngitis but given the severity of her symptoms will give RX for amoxicillin in case of false negative on her rapid strep today.  COVID and flu negative.  Upon reevaluation she is now talking and reports that her neck pain is somewhat improved  This patient presents to the ED for concern of complaints  listed in HPI, this involves an extensive number of treatment options, and is a complaint that carries with it a high risk of complications and morbidity. Disposition including potential need for admission considered.   Dispo: DC Home. Return precautions discussed including, but not limited to, those listed in the AVS. Allowed pt time to ask questions which were answered fully prior to dc.  Records reviewed Outpatient Clinic Notes I independently reviewed the following imaging with scope of interpretation limited to determining acute life threatening conditions related to emergency care: CT soft tissue neck and agree with the radiologist interpretation with the following exceptions: none I have reviewed the patients home medications and made adjustments as needed  Portions of this note were generated with Dragon dictation software. Dictation errors may occur despite best attempts at proofreading.     Final diagnoses:  Pharyngitis, unspecified etiology    ED Discharge Orders           Ordered    amoxicillin-clavulanate (AUGMENTIN) 875-125 MG tablet  Every 12 hours,   Status:  Discontinued        09/13/24 0951    amoxicillin-clavulanate (AUGMENTIN) 875-125 MG tablet  Every 12 hours        09/13/24 1002               Yolande Lamar BROCKS, MD 09/13/24 1005

## 2024-09-13 NOTE — Discharge Instructions (Signed)
 You were seen for sore throat in the emergency department.   At home, please take Tylenol  and ibuprofen  for your symptoms.  Take the amoxicillin in case you do have bacterial pharyngitis.  Check your MyChart online for the results of any tests that had not resulted by the time you left the emergency department.   Follow-up with your primary doctor in 2-3 days regarding your visit.    Return immediately to the emergency department if you experience any of the following: Difficulty breathing, or any other concerning symptoms.    Thank you for visiting our Emergency Department. It was a pleasure taking care of you today.
